# Patient Record
Sex: Male | Born: 1940 | Race: White | Hispanic: No | Marital: Married | Smoking: Former smoker
Health system: Southern US, Community
[De-identification: ages and names within clinical notes are randomized; demographics above are authoritative.]

## PROBLEM LIST (undated history)

## (undated) DIAGNOSIS — E079 Disorder of thyroid, unspecified: Secondary | ICD-10-CM

## (undated) DIAGNOSIS — F1010 Alcohol abuse, uncomplicated: Secondary | ICD-10-CM

## (undated) DIAGNOSIS — I10 Essential (primary) hypertension: Secondary | ICD-10-CM

## (undated) DIAGNOSIS — F0390 Unspecified dementia without behavioral disturbance: Secondary | ICD-10-CM

## (undated) HISTORY — PX: DENTAL SURGERY: SHX609

## (undated) HISTORY — DX: Alcohol abuse, uncomplicated: F10.10

## (undated) HISTORY — PX: EYE SURGERY: SHX253

## (undated) HISTORY — PX: FRACTURE SURGERY: SHX138

## (undated) HISTORY — PX: HAND SURGERY: SHX662

---

## 1998-10-25 ENCOUNTER — Other Ambulatory Visit: Admission: RE | Admit: 1998-10-25 | Discharge: 1998-10-25 | Payer: Self-pay | Admitting: Gastroenterology

## 1999-02-22 ENCOUNTER — Encounter: Payer: Self-pay | Admitting: Orthopedic Surgery

## 1999-02-22 ENCOUNTER — Inpatient Hospital Stay (HOSPITAL_COMMUNITY): Admission: EM | Admit: 1999-02-22 | Discharge: 1999-02-22 | Payer: Self-pay | Admitting: Emergency Medicine

## 1999-02-22 ENCOUNTER — Encounter: Payer: Self-pay | Admitting: Emergency Medicine

## 2000-04-29 ENCOUNTER — Emergency Department (HOSPITAL_COMMUNITY): Admission: EM | Admit: 2000-04-29 | Discharge: 2000-04-29 | Payer: Self-pay | Admitting: *Deleted

## 2003-06-23 ENCOUNTER — Encounter (HOSPITAL_COMMUNITY): Admission: RE | Admit: 2003-06-23 | Discharge: 2003-09-21 | Payer: Self-pay | Admitting: *Deleted

## 2004-03-21 ENCOUNTER — Ambulatory Visit (HOSPITAL_COMMUNITY): Admission: RE | Admit: 2004-03-21 | Discharge: 2004-03-21 | Payer: Self-pay | Admitting: Gastroenterology

## 2004-10-16 ENCOUNTER — Ambulatory Visit: Payer: Self-pay | Admitting: Hematology & Oncology

## 2004-12-12 ENCOUNTER — Ambulatory Visit: Payer: Self-pay | Admitting: Hematology & Oncology

## 2005-01-29 ENCOUNTER — Ambulatory Visit: Payer: Self-pay | Admitting: Hematology & Oncology

## 2005-03-23 ENCOUNTER — Ambulatory Visit: Payer: Self-pay | Admitting: Hematology & Oncology

## 2005-05-18 ENCOUNTER — Ambulatory Visit: Payer: Self-pay | Admitting: Hematology & Oncology

## 2005-08-27 ENCOUNTER — Ambulatory Visit: Payer: Self-pay | Admitting: Hematology & Oncology

## 2005-10-16 ENCOUNTER — Ambulatory Visit: Payer: Self-pay | Admitting: Hematology & Oncology

## 2005-12-20 ENCOUNTER — Ambulatory Visit: Payer: Self-pay | Admitting: Hematology & Oncology

## 2006-02-13 ENCOUNTER — Ambulatory Visit: Payer: Self-pay | Admitting: Hematology & Oncology

## 2006-05-28 ENCOUNTER — Ambulatory Visit: Payer: Self-pay | Admitting: Hematology & Oncology

## 2006-05-30 LAB — CBC WITH DIFFERENTIAL/PLATELET
Basophils Absolute: 0 10*3/uL (ref 0.0–0.1)
EOS%: 1.8 % (ref 0.0–7.0)
HGB: 16 g/dL (ref 13.0–17.1)
LYMPH%: 9.6 % — ABNORMAL LOW (ref 14.0–48.0)
MCH: 34.8 pg — ABNORMAL HIGH (ref 28.0–33.4)
MCV: 99.1 fL — ABNORMAL HIGH (ref 81.6–98.0)
MONO%: 9 % (ref 0.0–13.0)
Platelets: 217 10*3/uL (ref 145–400)
RDW: 12.4 % (ref 11.2–14.6)

## 2006-07-23 ENCOUNTER — Ambulatory Visit: Payer: Self-pay | Admitting: Hematology & Oncology

## 2006-07-25 LAB — CBC WITH DIFFERENTIAL/PLATELET
Basophils Absolute: 0 10*3/uL (ref 0.0–0.1)
Eosinophils Absolute: 0.1 10*3/uL (ref 0.0–0.5)
HCT: 47.3 % (ref 38.7–49.9)
HGB: 16.7 g/dL (ref 13.0–17.1)
LYMPH%: 9.8 % — ABNORMAL LOW (ref 14.0–48.0)
MCV: 98.5 fL — ABNORMAL HIGH (ref 81.6–98.0)
MONO%: 11.1 % (ref 0.0–13.0)
NEUT#: 5.3 10*3/uL (ref 1.5–6.5)
NEUT%: 77 % — ABNORMAL HIGH (ref 40.0–75.0)
Platelets: 261 10*3/uL (ref 145–400)

## 2006-08-01 LAB — CBC WITH DIFFERENTIAL/PLATELET
BASO%: 0.3 % (ref 0.0–2.0)
Basophils Absolute: 0 10*3/uL (ref 0.0–0.1)
EOS%: 1.6 % (ref 0.0–7.0)
HCT: 45.4 % (ref 38.7–49.9)
LYMPH%: 10.7 % — ABNORMAL LOW (ref 14.0–48.0)
MCH: 34.8 pg — ABNORMAL HIGH (ref 28.0–33.4)
MCHC: 35.2 g/dL (ref 32.0–35.9)
NEUT%: 80.2 % — ABNORMAL HIGH (ref 40.0–75.0)
Platelets: 314 10*3/uL (ref 145–400)

## 2006-09-10 ENCOUNTER — Ambulatory Visit: Payer: Self-pay | Admitting: Hematology & Oncology

## 2006-12-25 ENCOUNTER — Ambulatory Visit: Payer: Self-pay | Admitting: Hematology & Oncology

## 2006-12-25 LAB — CBC WITH DIFFERENTIAL/PLATELET
BASO%: 0.2 % (ref 0.0–2.0)
EOS%: 1.3 % (ref 0.0–7.0)
Eosinophils Absolute: 0.1 10*3/uL (ref 0.0–0.5)
LYMPH%: 10.2 % — ABNORMAL LOW (ref 14.0–48.0)
MCH: 31.5 pg (ref 28.0–33.4)
MCHC: 32.8 g/dL (ref 32.0–35.9)
MCV: 96.2 fL (ref 81.6–98.0)
MONO%: 7.8 % (ref 0.0–13.0)
Platelets: 237 10*3/uL (ref 145–400)
RBC: 5.1 10*6/uL (ref 4.20–5.71)

## 2006-12-26 LAB — FERRITIN: Ferritin: 98 ng/mL (ref 22–322)

## 2007-02-25 ENCOUNTER — Ambulatory Visit: Payer: Self-pay | Admitting: Hematology & Oncology

## 2007-02-26 LAB — CBC WITH DIFFERENTIAL/PLATELET
BASO%: 0.4 % (ref 0.0–2.0)
EOS%: 1.1 % (ref 0.0–7.0)
HCT: 43.2 % (ref 38.7–49.9)
MCH: 34.7 pg — ABNORMAL HIGH (ref 28.0–33.4)
MCHC: 35.8 g/dL (ref 32.0–35.9)
NEUT%: 82.9 % — ABNORMAL HIGH (ref 40.0–75.0)
RBC: 4.47 10*6/uL (ref 4.20–5.71)
lymph#: 1 10*3/uL (ref 0.9–3.3)

## 2007-04-29 ENCOUNTER — Ambulatory Visit: Payer: Self-pay | Admitting: Hematology & Oncology

## 2007-05-07 ENCOUNTER — Emergency Department (HOSPITAL_COMMUNITY): Admission: EM | Admit: 2007-05-07 | Discharge: 2007-05-07 | Payer: Self-pay | Admitting: Emergency Medicine

## 2007-06-23 ENCOUNTER — Ambulatory Visit: Payer: Self-pay | Admitting: Hematology & Oncology

## 2007-06-25 LAB — CBC WITH DIFFERENTIAL/PLATELET
EOS%: 1.4 % (ref 0.0–7.0)
Eosinophils Absolute: 0.1 10*3/uL (ref 0.0–0.5)
MCH: 34.4 pg — ABNORMAL HIGH (ref 28.0–33.4)
MCV: 95.5 fL (ref 81.6–98.0)
MONO%: 7.1 % (ref 0.0–13.0)
NEUT#: 7.6 10*3/uL — ABNORMAL HIGH (ref 1.5–6.5)
RBC: 4.73 10*6/uL (ref 4.20–5.71)
RDW: 12.8 % (ref 11.2–14.6)

## 2007-08-25 ENCOUNTER — Ambulatory Visit: Payer: Self-pay | Admitting: Hematology & Oncology

## 2007-10-24 ENCOUNTER — Ambulatory Visit: Payer: Self-pay | Admitting: Hematology & Oncology

## 2008-08-26 ENCOUNTER — Ambulatory Visit (HOSPITAL_COMMUNITY): Admission: RE | Admit: 2008-08-26 | Discharge: 2008-08-26 | Payer: Self-pay | Admitting: General Surgery

## 2009-09-23 ENCOUNTER — Encounter: Admission: RE | Admit: 2009-09-23 | Discharge: 2009-09-23 | Payer: Self-pay | Admitting: Family Medicine

## 2011-04-24 NOTE — Op Note (Signed)
NAME:  Michael Weaver, Michael Weaver NO.:  [id number]   MEDICAL RECORD NO.:  [id number]          PATIENT TYPE:  AMB   LOCATION:  DAY                          FACILITY:  [facility]   PHYSICIAN:  Juanetta Gosling, MDDATE OF BIRTH:  01-21-1941   DATE OF PROCEDURE:  08/26/2008  DATE OF DISCHARGE:                               OPERATIVE REPORT   PREOPERATIVE DIAGNOSIS:  Recurrent left inguinal hernia.   POSTOPERATIVE DIAGNOSIS:  Recurrent indirect left inguinal hernia.   PROCEDURE:  Laparoscopic left inguinal hernia repair with 12 x 15 cm  UltraPro mesh.   SURGEON:  Juanetta Gosling, MD   ASSISTANT:  Ardeth Sportsman, MD   ANESTHESIA:  General.   SPECIMENS:  None.   DRAINS:  None.   COMPLICATIONS:  None.   ESTIMATED BLOOD LOSS:  Minimal.   DISPOSITION:  To PACU in stable condition.  Mr. Kling is a 70 year old  male who has a prior history of bilateral open repairs in the 60s and he  returns with a several day history of a left groin bulge.  On exam he  had a reducible left inguinal hernia.  I counseled him for laparoscopic  repair of this recurrent hernia.   PROCEDURE:  After informed consent was obtained, the patient was taken  to the operating room.  He was administered 1 gram of intravenous Ancef  prior to surgery.  He then underwent general endotracheal anesthesia  without complication.  A Foley catheter was placed.  His abdomen was  then prepped and draped in standard sterile surgical fashion.  Surgical  time-out was then performed. A 10 mm infraumbilical curvilinear incision  was then made.  Dissection was carried out down to the level of his  external abdominal oblique.  This was incised, the rectus muscles pulled  lateral.  The dissection balloon was then inserted with the help of an S  retractor into the preperitoneal space.  A 0 degree laparoscopic was  then inserted and the balloon was blown up to create our preperitoneal  space.  I used 40 pumps of the  balloon initially I then deflated it,  rolled it more laterally on the left side and used an additional 15  pumps on the balloon to open up the space.  After this, the balloon was  then removed the trocar inserted.  The gas was turned on.  The space was  visualized where there was good hemostasis.  I then began to perform  dissection laterally and identified the psoas muscle with the nerves  running on the psoas muscle.  He had a large indirect hernia sac and a  small direct hernia sac.  The cord structures were identified to include  the vas deferens and preserved throughout the operation.  The hernia sac  was then teased from the cord structures and pulled from the hernia and  this was then pulled back cephalad so that at the completion of this the  entire cord, the iliac vein were visualized easily.  He also a small  direct hernia which reduced with the balloon insufflation.  When  the  hernia sac had been pulled far enough cephalad, all structures had been  identified.  I then used a 12 x 15 cm UltraPro mesh and placed this over  the entire region.  This was tacked once laterally and superiorly near  the ASIS or at the level of the ASIS.  This was also then tacked just  above the direct defect to the anterior abdominal wall. This was also  then tacked twice to Cooper's ligament. The mesh was in good position at  the completion of this.  I then pulled the hernia sac cephalad and  desufflated the abdomen and laid hernia sac on top of the mesh.  After  all the trocars were then removed, the fascia was then closed with a 0  Vicryl suture.  Skin was all closed with 4-0 Monocryl suture in a  subcuticular fashion.  Dermabond was placed on the wounds.  Foley was  removed in the operating room, he was extubated, transferred to PACU in  stable condition.      Juanetta Gosling, MD  Electronically Signed     MCW/MEDQ  D:  08/26/2008  T:  08/28/2008  Job:  [id number]

## 2011-04-27 NOTE — Op Note (Signed)
NAME:  Michael Weaver, Michael Weaver NO.:  [id number]   MEDICAL RECORD NO.:  [id number]                   PATIENT TYPE:  AMB   LOCATION:  ENDO                                 FACILITY:  [facility]   PHYSICIAN:  Graylin Shiver, M.D.                DATE OF BIRTH:  1941/03/17   DATE OF PROCEDURE:  03/21/2004  DATE OF DISCHARGE:                                 OPERATIVE REPORT   INDICATIONS:  History of colon polyp.   Informed consent was obtained after the explanation of the risks of  bleeding, infection, and perforation.   PREMEDICATION:  Fentanyl 50 mcg IV, Versed 4 mg IV.   PROCEDURE:  With the patient in the left lateral decubitus position, a  rectal exam was performed.  No masses were felt.  The Olympus colonoscope  was inserted into the rectum and advanced around the colon to the cecum.  Cecal landmarks were identified.  The scope was brought out, visualizing the  colonic mucosa, which showed universal diverticulosis.  No polyps were seen.  The rectum and base of the cecum were free of diverticula, although there  were scattered diverticula around the rest of the colon.  He tolerated the  procedure well without complications.   IMPRESSION:  Universal diverticulosis.   PLAN:  In view of his history of colon polyps in the past, I would recommend  a follow-up surveillance colonoscopy in five years.                                               Graylin Shiver, M.D.    SFG/MEDQ  D:  03/21/2004  T:  03/21/2004  Job:  [id number]   cc:   Stacie Acres. White, M.D.  [address]  [place]  [place] [address]  Fax: [phone]

## 2011-09-12 LAB — CBC
HCT: 47.8
MCV: 102.4 — ABNORMAL HIGH
RBC: 4.67
WBC: 7.7

## 2011-09-12 LAB — COMPREHENSIVE METABOLIC PANEL
AST: 24
BUN: 12
CO2: 31
Chloride: 97
Creatinine, Ser: 0.96
GFR calc Af Amer: >60
GFR calc non Af Amer: >60
Total Bilirubin: 1

## 2011-09-12 LAB — DIFFERENTIAL
Basophils Absolute: 0
Eosinophils Relative: 1
Lymphocytes Relative: 8 — ABNORMAL LOW

## 2012-08-20 ENCOUNTER — Inpatient Hospital Stay (HOSPITAL_COMMUNITY)
Admission: EM | Admit: 2012-08-20 | Discharge: 2012-08-23 | DRG: 683 | Disposition: A | Discharge status: Home / Self Care | Payer: Medicare Other | Attending: Internal Medicine | Admitting: Internal Medicine

## 2012-08-20 ENCOUNTER — Emergency Department (HOSPITAL_COMMUNITY): Payer: Medicare Other

## 2012-08-20 ENCOUNTER — Encounter (HOSPITAL_COMMUNITY): Payer: Self-pay | Admitting: Emergency Medicine

## 2012-08-20 DIAGNOSIS — E860 Dehydration: Secondary | ICD-10-CM

## 2012-08-20 DIAGNOSIS — E871 Hypo-osmolality and hyponatremia: Secondary | ICD-10-CM | POA: Diagnosis present

## 2012-08-20 DIAGNOSIS — R197 Diarrhea, unspecified: Secondary | ICD-10-CM | POA: Diagnosis present

## 2012-08-20 DIAGNOSIS — Z87891 Personal history of nicotine dependence: Secondary | ICD-10-CM

## 2012-08-20 DIAGNOSIS — Z66 Do not resuscitate: Secondary | ICD-10-CM | POA: Diagnosis present

## 2012-08-20 DIAGNOSIS — R634 Abnormal weight loss: Secondary | ICD-10-CM | POA: Diagnosis present

## 2012-08-20 DIAGNOSIS — F0390 Unspecified dementia without behavioral disturbance: Secondary | ICD-10-CM | POA: Diagnosis present

## 2012-08-20 DIAGNOSIS — R946 Abnormal results of thyroid function studies: Secondary | ICD-10-CM | POA: Diagnosis present

## 2012-08-20 DIAGNOSIS — E875 Hyperkalemia: Secondary | ICD-10-CM | POA: Diagnosis present

## 2012-08-20 DIAGNOSIS — N179 Acute kidney failure, unspecified: Secondary | ICD-10-CM

## 2012-08-20 DIAGNOSIS — I959 Hypotension, unspecified: Secondary | ICD-10-CM | POA: Diagnosis present

## 2012-08-20 DIAGNOSIS — N170 Acute kidney failure with tubular necrosis: Principal | ICD-10-CM | POA: Diagnosis present

## 2012-08-20 DIAGNOSIS — R7989 Other specified abnormal findings of blood chemistry: Secondary | ICD-10-CM | POA: Diagnosis present

## 2012-08-20 DIAGNOSIS — K08409 Partial loss of teeth, unspecified cause, unspecified class: Secondary | ICD-10-CM | POA: Diagnosis present

## 2012-08-20 DIAGNOSIS — I10 Essential (primary) hypertension: Secondary | ICD-10-CM | POA: Diagnosis present

## 2012-08-20 HISTORY — DX: Essential (primary) hypertension: I10

## 2012-08-20 HISTORY — DX: Unspecified dementia, unspecified severity, without behavioral disturbance, psychotic disturbance, mood disturbance, and anxiety: F03.90

## 2012-08-20 LAB — COMPREHENSIVE METABOLIC PANEL
BUN: 78 mg/dL — ABNORMAL HIGH (ref 6–23)
CO2: 18 mEq/L — ABNORMAL LOW (ref 19–32)
Calcium: 8.8 mg/dL (ref 8.4–10.5)
Chloride: 101 mEq/L (ref 96–112)
Creatinine, Ser: 5.53 mg/dL — ABNORMAL HIGH (ref 0.50–1.35)
GFR calc Af Amer: 11 mL/min — ABNORMAL LOW (ref >90)
GFR calc non Af Amer: 9 mL/min — ABNORMAL LOW (ref >90)
Total Bilirubin: 0.3 mg/dL (ref 0.3–1.2)

## 2012-08-20 LAB — CBC WITH DIFFERENTIAL/PLATELET
Basophils Absolute: 0 10*3/uL (ref 0.0–0.1)
Eosinophils Relative: 1 % (ref 0–5)
Lymphocytes Relative: 6 % — ABNORMAL LOW (ref 12–46)
MCV: 98.4 fL (ref 78.0–100.0)
Neutrophils Relative %: 85 % — ABNORMAL HIGH (ref 43–77)
Platelets: 191 10*3/uL (ref 150–400)
RDW: 12.1 % (ref 11.5–15.5)
WBC: 8.9 10*3/uL (ref 4.0–10.5)

## 2012-08-20 LAB — CK TOTAL AND CKMB (NOT AT ARMC): Total CK: 83 U/L (ref 7–232)

## 2012-08-20 LAB — URINALYSIS, ROUTINE W REFLEX MICROSCOPIC
Hgb urine dipstick: NEGATIVE
Nitrite: NEGATIVE
Protein, ur: NEGATIVE mg/dL
Specific Gravity, Urine: 1.02 (ref 1.005–1.030)
Urobilinogen, UA: 0.2 mg/dL (ref 0.0–1.0)

## 2012-08-20 LAB — URINE MICROSCOPIC-ADD ON

## 2012-08-20 LAB — LACTIC ACID, PLASMA: Lactic Acid, Venous: 1.4 mmol/L (ref 0.5–2.2)

## 2012-08-20 MED ORDER — HYDROCODONE-ACETAMINOPHEN 5-325 MG PO TABS: 1.0000 | ORAL_TABLET | ORAL | Status: DC | PRN

## 2012-08-20 MED ORDER — SODIUM CHLORIDE 0.9 % IV BOLUS (SEPSIS)
1000.0000 mL | Freq: Once | INTRAVENOUS | Status: AC
  Administered 2012-08-20: 1000 mL via INTRAVENOUS

## 2012-08-20 MED ORDER — SODIUM CHLORIDE 0.9 % IV SOLN: INTRAVENOUS | Status: AC

## 2012-08-20 MED ORDER — THIAMINE HCL 100 MG/ML IJ SOLN
Freq: Once | INTRAVENOUS | Status: AC
  Administered 2012-08-21: 03:00:00 via INTRAVENOUS
  Filled 2012-08-20: qty 1000

## 2012-08-20 MED ORDER — ALBUTEROL SULFATE (5 MG/ML) 0.5% IN NEBU: 2.5000 mg | INHALATION_SOLUTION | RESPIRATORY_TRACT | Status: DC | PRN

## 2012-08-20 MED ORDER — DEXTROSE 50 % IV SOLN
1.0000 | Freq: Once | INTRAVENOUS | Status: AC
  Administered 2012-08-20: 50 mL via INTRAVENOUS
  Filled 2012-08-20: qty 50

## 2012-08-20 MED ORDER — ACETAMINOPHEN 650 MG RE SUPP: 650.0000 mg | Freq: Four times a day (QID) | RECTAL | Status: DC | PRN

## 2012-08-20 MED ORDER — LACTATED RINGERS IV BOLUS (SEPSIS)
1000.0000 mL | Freq: Once | INTRAVENOUS | Status: AC
  Administered 2012-08-20: 1000 mL via INTRAVENOUS

## 2012-08-20 MED ORDER — DONEPEZIL HCL 10 MG PO TABS
10.0000 mg | ORAL_TABLET | Freq: Every day | ORAL | Status: DC
  Administered 2012-08-21 – 2012-08-22 (×2): 10 mg via ORAL
  Filled 2012-08-20 (×4): qty 1

## 2012-08-20 MED ORDER — DEXTROSE IN LACTATED RINGERS 5 % IV SOLN
INTRAVENOUS | Status: DC
  Administered 2012-08-21: 16:00:00 via INTRAVENOUS

## 2012-08-20 MED ORDER — INSULIN ASPART 100 UNIT/ML ~~LOC~~ SOLN
10.0000 [IU] | Freq: Once | SUBCUTANEOUS | Status: AC
  Administered 2012-08-20: 10 [IU] via INTRAVENOUS
  Filled 2012-08-20: qty 1

## 2012-08-20 MED ORDER — LEVOTHYROXINE SODIUM 150 MCG PO TABS
150.0000 ug | ORAL_TABLET | Freq: Every day | ORAL | Status: DC
  Administered 2012-08-21 – 2012-08-23 (×3): 150 ug via ORAL
  Filled 2012-08-20 (×3): qty 1

## 2012-08-20 MED ORDER — CITALOPRAM HYDROBROMIDE 40 MG PO TABS
40.0000 mg | ORAL_TABLET | Freq: Every day | ORAL | Status: DC
  Administered 2012-08-21 – 2012-08-23 (×3): 40 mg via ORAL
  Filled 2012-08-20 (×3): qty 1

## 2012-08-20 MED ORDER — ACETAMINOPHEN 325 MG PO TABS: 650.0000 mg | ORAL_TABLET | Freq: Four times a day (QID) | ORAL | Status: DC | PRN

## 2012-08-20 MED ORDER — DEXTROSE IN LACTATED RINGERS 5 % IV SOLN
INTRAVENOUS | Status: DC
  Administered 2012-08-20: 19:00:00 via INTRAVENOUS

## 2012-08-20 MED ORDER — PANTOPRAZOLE SODIUM 40 MG PO TBEC
40.0000 mg | DELAYED_RELEASE_TABLET | Freq: Every day | ORAL | Status: DC
  Administered 2012-08-21 – 2012-08-23 (×3): 40 mg via ORAL
  Filled 2012-08-20 (×3): qty 1

## 2012-08-20 MED ORDER — SODIUM CHLORIDE 0.9 % IV SOLN
1.0000 g | Freq: Once | INTRAVENOUS | Status: AC
  Administered 2012-08-20: 1 g via INTRAVENOUS
  Filled 2012-08-20 (×2): qty 10

## 2012-08-20 MED ORDER — SODIUM CHLORIDE 0.9 % IJ SOLN
3.0000 mL | Freq: Two times a day (BID) | INTRAMUSCULAR | Status: DC
  Administered 2012-08-21 – 2012-08-23 (×5): 3 mL via INTRAVENOUS

## 2012-08-20 MED ORDER — ONDANSETRON HCL 4 MG PO TABS: 4.0000 mg | ORAL_TABLET | Freq: Four times a day (QID) | ORAL | Status: DC | PRN

## 2012-08-20 MED ORDER — ONDANSETRON HCL 4 MG/2ML IJ SOLN
4.0000 mg | Freq: Four times a day (QID) | INTRAMUSCULAR | Status: DC | PRN
  Filled 2012-08-20: qty 2

## 2012-08-20 MED ORDER — GUAIFENESIN-DM 100-10 MG/5ML PO SYRP
5.0000 mL | ORAL_SOLUTION | ORAL | Status: DC | PRN
  Filled 2012-08-20: qty 5

## 2012-08-20 NOTE — ED Notes (Signed)
EDP in to assess 

## 2012-08-20 NOTE — ED Notes (Signed)
Pt aware of need for urine specimen; urinal placed at bedside  

## 2012-08-20 NOTE — ED Provider Notes (Signed)
History     CSN: [id number]  Arrival date & time 08/20/12  1531   First MD Initiated Contact with Patient 08/20/12 1546      Chief Complaint  Patient presents with  . Hypotension    (Consider location/radiation/quality/duration/timing/severity/associated sxs/prior treatment) HPIPaul D Weaver is a 71 y.o. male presents for "hypotension". Patient had a followup visit with his dentist this morning and was found to be hypotensive and was referred to the emergency department. He has a past pertinent surgical history of having had all of his mandibular teeth removed about 3 weeks ago and since then has not been eating well and has had intermittent diarrhea. He lives next door to his daughter, she is his caregiver however it is not privy to everything he does - he is a bit secretive about ailments.  Patient is currently not complaining about any pain but does say he's a bit fatigued.  He said some dizziness on standing.  The dizziness, has been going on for about 2 weeks. It has been steady, constant.   Denies any fevers, chills, chest pain, shortnes, syncopes, numbness or tingling, shortness of breath.  Past Medical History  Diagnosis Date  . Dementia   . Hypertension     Past Surgical History  Procedure Date  . Dental surgery   . Hand surgery     No family history on file.  History  Substance Use Topics  . Smoking status: Never Smoker   . Smokeless tobacco: Not on file  . Alcohol Use: Yes   Review of SystemsAt least 10pt or greater review of systems completed and are negative except where specified in the HPI.  Allergies  Review of patient's allergies indicates no known allergies.  Home Medications   Current Outpatient Rx  Name Route Sig Dispense Refill  . VITAMIN D 1000 UNITS PO TABS Oral Take 1,000 Units by mouth daily.    Marland Kitchen CITALOPRAM HYDROBROMIDE 40 MG PO TABS Oral Take 40 mg by mouth daily.    . DONEPEZIL HCL 10 MG PO TABS Oral Take 10 mg by mouth at bedtime.    Marland Kitchen  LEVOTHYROXINE SODIUM 150 MCG PO TABS Oral Take 150 mcg by mouth daily.    Marland Kitchen LISINOPRIL 10 MG PO TABS Oral Take 10 mg by mouth daily.    Marland Kitchen MELATONIN 1 MG PO TABS Oral Take 1 tablet by mouth at bedtime.    Marland Kitchen PRENATAL MULTIVITAMIN CH Oral Take 1 tablet by mouth daily.      BP 88/50  Pulse 63  Temp 98 F (36.7 C) (Oral)  Resp 18  SpO2 98%  Physical Exam  Nursing notes reviewed.  Electronic medical record reviewed. VITAL SIGNS:   Filed Vitals:   08/20/12 1613 08/20/12 1720 08/20/12 1852 08/20/12 1937  BP: 80/47 88/50 92/68  109/56  Pulse: 68 63 73 68  Temp:      TempSrc:   Oral   Resp:      SpO2: 97% 98% 100% 100%   CONSTITUTIONAL: Awake, oriented, appears non-toxic, thin HENT: Atraumatic, normocephalic, oral mucosa pink and moist, airway patent. Nares patent without drainage. External ears normal. EYES: Conjunctiva clear, EOMI, PERRLA NECK: Trachea midline, non-tender, supple CARDIOVASCULAR: Normal heart rate, Normal rhythm, No murmurs, rubs, gallops PULMONARY/CHEST: Clear to auscultation, no rhonchi, wheezes, or rales. Symmetrical breath sounds. Non-tender. ABDOMINAL: Non-distended, soft, non-tender - no rebound or guarding.  BS normal. NEUROLOGIC: Non-focal, moving all four extremities, no gross sensory or motor deficits. EXTREMITIES: No clubbing, cyanosis, or edema  SKIN: Warm, Dry, No erythema, No rash   ED Course  Procedures (including critical care time)   Date: 08/20/2012  Rate: 67  Rhythm: normal sinus rhythm  QRS Axis: normal  Intervals: normal  ST/T Wave abnormalities: normal  Conduction Disutrbances: none  Narrative Interpretation: unremarkable, no peaked T waves, and nonischemic. Negative when compared with prior EKG dated 08/23/2008   Labs Reviewed  COMPREHENSIVE METABOLIC PANEL - Abnormal; Notable for the following:    Sodium 132 (*)     Potassium 6.0 (*)     CO2 18 (*)     BUN 78 (*)     Creatinine, Ser 5.53 (*)     GFR calc non Af Amer 9 (*)     GFR  calc Af Amer 11 (*)     All other components within normal limits  CBC WITH DIFFERENTIAL - Abnormal; Notable for the following:    MCH 34.8 (*)     Neutrophils Relative 85 (*)     Lymphocytes Relative 6 (*)     Lymphs Abs 0.5 (*)     All other components within normal limits  LACTIC ACID, PLASMA  URINALYSIS, ROUTINE W REFLEX MICROSCOPIC  CULTURE, BLOOD (ROUTINE X 2)  CULTURE, BLOOD (ROUTINE X 2)   Dg Chest 2 View  08/20/2012  *RADIOLOGY REPORT*  Clinical Data: Hypotension, weakness.  CHEST - 2 VIEW  Comparison: 08/23/2008.  Findings: Heart and mediastinal contours are within normal limits. No focal opacities or effusions.  No acute bony abnormality.  Old right-sided rib fractures.  IMPRESSION: No active cardiopulmonary disease.   Original Report Authenticated By: Cyndie Chime, M.D.    No diagnosis found.  MDM  Michael Weaver is a 71 y.o. male with the recent past medical history dental surgery presenting with weakness, diarrhea and dizzinesOverall the patient looks well however he is hypotensive Hytrin and started fluid resuscitation. Patient's labs revealed acute renal failure with hyperkalemia potassium 6.0. There are no peaked T waves, there is no bradycardia. We'll treat the hyperkalemia with insulin and calcium gluconate.  Patient continues to be fluid resuscitated, discussed with hospitalist for admission for acute renal failure likely due to prerenal failure secondary to dehydration secondary to a tooth extraction. Does not appear the patient has an infection. It also does not appear like that the patient has been on any new medications causing acute renal failure. Patient does have a waxy casts in his urine suggestive of acute tubular necrosis.         Jones Skene, MD 08/20/12 2050

## 2012-08-20 NOTE — ED Notes (Signed)
Pt seen by dentist this morning and BP 89/54.  Daughter was told to keep monitoring BP and at home it was 82/49.  Pt had dental surgery 3 weeks ago and has had decreased po intake.  Reports diarrhea and dizziness x 2 weeks.

## 2012-08-20 NOTE — H&P (Signed)
PCP:   Bradd Burner, PA    Chief Complaint:   Hypotension at home 83/49  HPI: Michael Weaver is a 71 y.o. male   has a past medical history of Dementia and Hypertension.   Presented with   3 weeks history of diarrhea poor PO intake. He have had dental surgeries done to remove all of his lower teeth due to decay. He was not able to eat except fluid food and he has not been consuming enough. He started to feel light headed.  Once he started to have only liquid diet he began to have diarrhea. His diet consisted of coffee, cranberry juice and V8.  His urine has become dark and he had decreased urine output he is not sure if he was even urinating at all for the past few days. He denies getting any antibiotics. NO nausea, no vomiting no fever or chills.  He lives at home but next door to his daughter. Today she stopped over to check on him and checked his blood pressure that was found to be very low with SBP of 83 at this point he was brought in to ER Review of Systems:    Pertinent positives include:Tooth/dental problems, diarrhea,  Constitutional:  No weight loss, night sweats, Fevers, chills, fatigue, weight loss  HEENT:  No headaches, Difficulty swallowing,Sore throat,  No sneezing, itching, ear ache, nasal congestion, post nasal drip,  Cardio-vascular:  No chest pain, Orthopnea, PND, anasarca, dizziness, palpitations.no Bilateral lower extremity swelling  GI:  No heartburn, indigestion, abdominal pain, nausea, vomiting,  change in bowel habits, loss of appetite, melena, blood in stool, hematemesis Resp:  no shortness of breath at rest. No dyspnea on exertion, No excess mucus, no productive cough, No non-productive cough, No coughing up of blood.No change in color of mucus.No wheezing. Skin:  no rash or lesions. No jaundice GU:  no dysuria, change in color of urine, no urgency or frequency. No straining to urinate.  No flank pain.  Musculoskeletal:  No joint pain or no  joint swelling. No decreased range of motion. No back pain.  Psych:  No change in mood or affect. No depression or anxiety. No memory loss.  Neuro: no localizing neurological complaints, no tingling, no weakness, no double vision, no gait abnormality, no slurred speech, no confusion  Otherwise ROS are negative except for above, 10 systems were reviewed  Past Medical History: Past Medical History  Diagnosis Date  . Dementia   . Hypertension    Past Surgical History  Procedure Date  . Dental surgery   . Hand surgery      Medications: Prior to Admission medications   Medication Sig Start Date End Date Taking? Authorizing Provider  cholecalciferol (VITAMIN D) 1000 UNITS tablet Take 1,000 Units by mouth daily.   Yes Historical Provider, MD  citalopram (CELEXA) 40 MG tablet Take 40 mg by mouth daily.   Yes Historical Provider, MD  donepezil (ARICEPT) 10 MG tablet Take 10 mg by mouth at bedtime.   Yes Historical Provider, MD  levothyroxine (SYNTHROID, LEVOTHROID) 150 MCG tablet Take 150 mcg by mouth daily.   Yes Historical Provider, MD  lisinopril (PRINIVIL,ZESTRIL) 10 MG tablet Take 10 mg by mouth daily.   Yes Historical Provider, MD  Melatonin 1 MG TABS Take 1 tablet by mouth at bedtime.   Yes Historical Provider, MD  Prenatal Vit-Fe Fumarate-FA (PRENATAL MULTIVITAMIN) TABS Take 1 tablet by mouth daily.   Yes Historical Provider, MD    Allergies:  No  Known Allergies  Social History:  Ambulatory independently  Lives at  Home alone MPOA: Pricilla Handler [phone]   reports that he has quit smoking. His smoking use included Cigarettes. He has a 30 pack-year smoking history. He does not have any smokeless tobacco history on file. He reports that he drinks alcohol. He reports that he does not use illicit drugs.   Family History: family history includes Breast cancer in his sister.    Physical Exam: Patient Vitals for the past 24 hrs:  BP Temp Temp src Pulse Resp SpO2    08/20/12 1937 109/56 mmHg - - 68  - 100 %  08/20/12 1852 92/68 mmHg - Oral 73  - 100 %  08/20/12 1720 88/50 mmHg - - 63  - 98 %  08/20/12 1613 80/47 mmHg - - 68  - 97 %  08/20/12 1537 81/46 mmHg 98 F (36.7 C) Oral 73  18  92 %    1. General:  in No Acute distress 2. Psychological: Alert and Oriented 3. Head/ENT:    Dry Mucous Membranes                          Head Non traumatic, neck supple                           Poor Dentition 4. SKIN:  decreased Skin turgor,  Skin clean Dry and intact no rash 5. Heart: Regular rate and rhythm systolic ejection Murmur, no Rub or gallop 6. Lungs: Clear to auscultation bilaterally, no wheezes or crackles   7. Abdomen: Soft, non-tender, Non distended 8. Lower extremities: no clubbing, cyanosis, or edema 9. Neurologically Grossly intact, moving all 4 extremities equally 10. MSK: Normal range of motion  body mass index is unknown because there is no height or weight on file.   Labs on Admission:   [facility] 08/20/12 1708  NA 132*  K 6.0*  CL 101  CO2 18*  GLUCOSE 93  BUN 78*  CREATININE 5.53*  CALCIUM 8.8  MG --  PHOS --    Basename 08/20/12 1708  AST 15  ALT 18  ALKPHOS 68  BILITOT 0.3  PROT 6.5  ALBUMIN 3.9   No results found for this basename: LIPASE:2,AMYLASE:2 in the last 72 hours  Basename 08/20/12 1708  WBC 8.9  NEUTROABS 7.6  HGB 15.0  HCT 42.4  MCV 98.4  PLT 191   No results found for this basename: CKTOTAL:3,CKMB:3,CKMBINDEX:3,TROPONINI:3 in the last 72 hours No results found for this basename: TSH,T4TOTAL,FREET3,T3FREE,THYROIDAB in the last 72 hours No results found for this basename: VITAMINB12:2,FOLATE:2,FERRITIN:2,TIBC:2,IRON:2,RETICCTPCT:2 in the last 72 hours No results found for this basename: HGBA1C    CrCl is unknown because there is no height on file for the current visit. ABG No results found for this basename: phart, pco2, po2, hco3, tco2, acidbasedef, o2sat     No results found for this  basename: DDIMER     Other results:  I have pearsonaly reviewed this: ECG REPORT  Rate: 67  Rhythm: Sinus rhythm ST&T Change: non specific changes  UA waxy casts, concentrated   Cultures: No results found for this basename: sdes, specrequest, cult, reptstatus       Radiological Exams on Admission: Dg Chest 2 View  08/20/2012  *RADIOLOGY REPORT*  Clinical Data: Hypotension, weakness.  CHEST - 2 VIEW  Comparison: 08/23/2008.  Findings: Heart and mediastinal contours are within normal limits.  No focal opacities or effusions.  No acute bony abnormality.  Old right-sided rib fractures.  IMPRESSION: No active cardiopulmonary disease.   Original Report Authenticated By: Cyndie Chime, M.D.     Chart has been reviewed  Assessment/Plan  71 yo w hx of mild dementia and HTN here with ARF in the setting of dehydration  Present on Admission:  .Hypotension - this is secondary to dehydration already improved after administration of IV fluids will watch patient and step down. He have not had any fevers or any other reason to suspect infection. Blood cultures were drawn by emergency department will await results of those. At this point as his blood pressures improving with IV fluids and his history is consistent with dehydration will hold off on IV antibiotics  .Acute renal failure - - likely secondary to dehydration, check FeNA and will treat with  IVF,  would obtain renal US and consider renal consult in a.m. at this point there is no indication for emergency dialysis will stop lisinopril   .Dementia -mild continue Aricept   .Dehydration - severe we'll rehydrate and keep an eye on his electrolytes  .Diarrhea - etiology unclear he states that this is likely secondary to fluid only diet we'll obtain stool cultures he denies any exposure to antibiotics  .Hyperkalemia - patient has been treated in emergency department already we'll repeat a electrolytes and see if he needs further treatment of his  potassium has improved  .Hyponatremia - - likely secondary to dehydration, will give IVF, check Urine Na, Cr, Osmolarity. Monitor Na levels to avoid over aggressive correction. Check TSH. Stop offending medications. If no improvement with IVF will initiate further work up for SIADH if appropriate.     Prophylaxis: SCD Protonix  CODE STATUS: DNR/DNI  Other plan as per orders.  I have spent a total of 55 min on this admission  Zarah Carbon 08/20/2012, 8:43 PM

## 2012-08-20 NOTE — ED Notes (Signed)
Reports diarrhea x3 weeks since having oral sx; had additional oral sx today (teeth being extracted for dentures); states was hypotensive in dentist's office; therefore, they sent him to ED for further eval; presently c/o interm dizziness; no c/o pain at this time; family at bedside; pt awake, alert, oriented x4; skin warm, dry

## 2012-08-20 NOTE — ED Notes (Signed)
Transported to xray dept via stretcher per xray staff 

## 2012-08-20 NOTE — ED Notes (Signed)
Dr.Bonk made aware of present BP - orders given - see MAR

## 2012-08-20 NOTE — ED Notes (Signed)
Report called to Carmelia Bake, RN in CDU

## 2012-08-21 ENCOUNTER — Inpatient Hospital Stay (HOSPITAL_COMMUNITY): Payer: Medicare Other

## 2012-08-21 DIAGNOSIS — R7989 Other specified abnormal findings of blood chemistry: Secondary | ICD-10-CM | POA: Diagnosis present

## 2012-08-21 DIAGNOSIS — R634 Abnormal weight loss: Secondary | ICD-10-CM | POA: Diagnosis present

## 2012-08-21 DIAGNOSIS — K08409 Partial loss of teeth, unspecified cause, unspecified class: Secondary | ICD-10-CM | POA: Diagnosis present

## 2012-08-21 LAB — CBC
HCT: 43.1 % (ref 39.0–52.0)
MCH: 34.3 pg — ABNORMAL HIGH (ref 26.0–34.0)
MCHC: 35 g/dL (ref 30.0–36.0)
MCV: 98 fL (ref 78.0–100.0)
RDW: 12.1 % (ref 11.5–15.5)
WBC: 6.5 10*3/uL (ref 4.0–10.5)

## 2012-08-21 LAB — PHOSPHORUS: Phosphorus: 4.7 mg/dL — ABNORMAL HIGH (ref 2.3–4.6)

## 2012-08-21 LAB — TSH: TSH: 0.269 u[IU]/mL — ABNORMAL LOW (ref 0.350–4.500)

## 2012-08-21 LAB — COMPREHENSIVE METABOLIC PANEL
ALT: 15 U/L (ref 0–53)
AST: 15 U/L (ref 0–37)
Albumin: 3.5 g/dL (ref 3.5–5.2)
Alkaline Phosphatase: 64 U/L (ref 39–117)
Chloride: 108 mEq/L (ref 96–112)
Potassium: 5.4 mEq/L — ABNORMAL HIGH (ref 3.5–5.1)
Sodium: 137 mEq/L (ref 135–145)
Total Protein: 6 g/dL (ref 6.0–8.3)

## 2012-08-21 LAB — BASIC METABOLIC PANEL
BUN: 70 mg/dL — ABNORMAL HIGH (ref 6–23)
BUN: 57 mg/dL — ABNORMAL HIGH (ref 6–23)
CO2: 18 mEq/L — ABNORMAL LOW (ref 19–32)
Calcium: 8.3 mg/dL — ABNORMAL LOW (ref 8.4–10.5)
Calcium: 8.4 mg/dL (ref 8.4–10.5)
Creatinine, Ser: 4.6 mg/dL — ABNORMAL HIGH (ref 0.50–1.35)
Creatinine, Ser: 2.39 mg/dL — ABNORMAL HIGH (ref 0.50–1.35)
GFR calc non Af Amer: 12 mL/min — ABNORMAL LOW (ref >90)
GFR calc non Af Amer: 20 mL/min — ABNORMAL LOW (ref >90)
Glucose, Bld: 135 mg/dL — ABNORMAL HIGH (ref 70–99)
Glucose, Bld: 137 mg/dL — ABNORMAL HIGH (ref 70–99)
Glucose, Bld: 78 mg/dL (ref 70–99)
Potassium: 5 mEq/L (ref 3.5–5.1)
Sodium: 136 mEq/L (ref 135–145)

## 2012-08-21 LAB — OSMOLALITY, URINE: Osmolality, Ur: 340 mOsm/kg — ABNORMAL LOW (ref 390–1090)

## 2012-08-21 LAB — MAGNESIUM: Magnesium: 2.4 mg/dL (ref 1.5–2.5)

## 2012-08-21 LAB — SODIUM, URINE, RANDOM: Sodium, Ur: 48 mEq/L

## 2012-08-21 MED ORDER — ENSURE COMPLETE PO LIQD
237.0000 mL | Freq: Three times a day (TID) | ORAL | Status: DC
  Administered 2012-08-22 – 2012-08-23 (×4): 237 mL via ORAL

## 2012-08-21 MED ORDER — SODIUM CHLORIDE 0.9 % IV SOLN
INTRAVENOUS | Status: DC
  Administered 2012-08-21 – 2012-08-23 (×4): via INTRAVENOUS

## 2012-08-21 MED ORDER — ENSURE PUDDING PO PUDG
1.0000 | Freq: Three times a day (TID) | ORAL | Status: DC
  Administered 2012-08-21 – 2012-08-23 (×5): 1 via ORAL

## 2012-08-21 MED ORDER — BOOST / RESOURCE BREEZE PO LIQD
1.0000 | Freq: Three times a day (TID) | ORAL | Status: DC
  Administered 2012-08-21: 1 via ORAL

## 2012-08-21 NOTE — Progress Notes (Signed)
[facility] Progress Note [place] TEAM 1 - Stepdown/ICU TEAM   Michael Weaver WUJ:[id number] DOB: [birth year] DOA: 08/20/2012 PCP: Bradd Burner, PA  Brief narrative: 71 year old male patient with underlying history of dementia and hypertension. Patient presented to the emergency department after his daughter discovered his pressure was low at 83 systolic. This patient had recently undergone extensive dental extraction 3 weeks prior and his diet has consisted of coffee cranberry juice, V8 juice. In addition he was having watery stools since the teeth extraction. In the emergency department he was found to be hypotensive but this did respond to IV fluids. He did not appear to have any criteria for sepsis. He was also found to have acute renal failure with hyperkalemia with a creatinine of 5.53. He was started on IV fluids and due to the degree of his renal failure with associated hyperkalemia he was admitted to the step down unit for further monitoring and treatment  Assessment/Plan: Principal Problem:  *ARF (acute renal failure) pre renal with tubular necrosis *Creatinine has steadily improved with rehydration and most recent reading after lunch time today is now down to 2.95. *Baseline creatinine in 2009 was 0.96 but normal GFR *FeNa calculated to be 1.64% and this appears to be consistent with ATN, likely from patient's concomitant use of his ACE inhibitor when dehydrated *BUN is elevated and this is consistent with prerenal etiology as well *Continue to monitor electrolyte panel *Has voided x1-we need to make sure patient has strict I & O so we can monitor urinary output volume *Followup on renal ultrasound result  Active Problems:  Hypotension/Dehydration *Resolving and we'll continue IV fluid hydration   Hyperkalemia/Hyponatremia *Secondary to dehydration and acute renal failure *We'll try to avoid giving Kayexalate since patient has underlying diarrhea *If potassium  is over 6 we'll need to repeat cardioprotective medications of insulin D50 and calcium gluconate and can consider a one-time dose of Kayexalate   Weight loss, non-intentional/Status post tooth extraction 3 weeks ago *Related to recent dental extraction and inability to eat as well as pain in now from attempts to wear dentures *Nutritional consultation *Begin supplements with resource beverage in nature putting   Abnormal TSH *TSH is low and likely reflective of sick euthyroid syndrome but will check free T4 and T3 as  precaution    Dementia   Diarrhea *Patient and daughter confirm he has not been on antibiotics with dental extraction or stent   DVT prophylaxis: SCDs Code Status: DO NOT RESUSCITATE Family Communication: Spoke with patient and his daughter Disposition Plan: Remain in stepdown  Consultants: None  Procedures: None  Antibiotics: None  HPI/Subjective: Patient alert and has flat affect. Eating clear liquids without difficulty. Denies pain but continues to endorse weakness.   Objective: Blood pressure 125/55, pulse 67, temperature 98.1 F (36.7 C), temperature source Oral, resp. rate 14, height 6' (1.829 m), weight 73 kg (160 lb 15 oz), SpO2 99.00%.  Intake/Output Summary (Last 24 hours) at 08/21/12 1515 Last data filed at 08/21/12 0746  Gross per 24 hour  Intake   1625 ml  Output      0 ml  Net   1625 ml     Exam: General: No acute respiratory distress Lungs: Clear to auscultation bilaterally without wheezes or crackles Cardiovascular: Regular rate and rhythm without murmur gallop or rub normal S1 and S2, the fluid at 125 cc per hour Abdomen: Nontender, nondistended, soft, bowel sounds positive, no rebound, no ascites, no appreciable mass Extremities: No significant cyanosis, clubbing,  or edema bilateral lower extremities  Data Reviewed: Basic Metabolic Panel:  Lab 08/21/12 0865 08/21/12 0440 08/21/12 0021 08/20/12 1958 08/20/12 1708  NA 136 137 135  -- 132*  K 5.5* 5.4* 5.0 -- 6.0*  CL 107 108 105 -- 101  CO2 19 16* 18* -- 18*  GLUCOSE 137* 97 135* -- 93  BUN 57* 68* 70* -- 78*  CREATININE 2.95* 4.07* 4.60* -- 5.53*  CALCIUM 8.3* 8.8 8.6 -- 8.8  MG -- 2.4 -- -- --  PHOS -- 4.7* -- 4.4 --   Liver Function Tests:  Lab 08/21/12 0440 08/20/12 1708  AST 15 15  ALT 15 18  ALKPHOS 64 68  BILITOT 0.4 0.3  PROT 6.0 6.5  ALBUMIN 3.5 3.9   No results found for this basename: LIPASE:5,AMYLASE:5 in the last 168 hours No results found for this basename: AMMONIA:5 in the last 168 hours CBC:  Lab 08/21/12 0440 08/20/12 1708  WBC 6.5 8.9  NEUTROABS -- 7.6  HGB 15.1 15.0  HCT 43.1 42.4  MCV 98.0 98.4  PLT 179 191   Cardiac Enzymes:  Lab 08/20/12 1958  CKTOTAL 83  CKMB 3.7  CKMBINDEX --  TROPONINI --   BNP (last 3 results) No results found for this basename: PROBNP:3 in the last 8760 hours CBG:  Lab 08/20/12 2011  GLUCAP 79    Recent Results (from the past 240 hour(s))  CULTURE, BLOOD (ROUTINE X 2)     Status: Normal (Preliminary result)   Collection Time   08/20/12  5:12 PM      Component Value Range Status Comment   Specimen Description BLOOD ARM RIGHT   Final    Special Requests BOTTLES DRAWN AEROBIC AND ANAEROBIC 10CC   Final    Culture  Setup Time 08/20/2012 22:48   Final    Culture     Final    Value:        BLOOD CULTURE RECEIVED NO GROWTH TO DATE CULTURE WILL BE HELD FOR 5 DAYS BEFORE ISSUING A FINAL NEGATIVE REPORT   Report Status PENDING   Incomplete   CULTURE, BLOOD (ROUTINE X 2)     Status: Normal (Preliminary result)   Collection Time   08/20/12  5:30 PM      Component Value Range Status Comment   Specimen Description BLOOD HAND RIGHT   Final    Special Requests BOTTLES DRAWN AEROBIC ONLY 8CC   Final    Culture  Setup Time 08/20/2012 22:46   Final    Culture     Final    Value:        BLOOD CULTURE RECEIVED NO GROWTH TO DATE CULTURE WILL BE HELD FOR 5 DAYS BEFORE ISSUING A FINAL NEGATIVE REPORT    Report Status PENDING   Incomplete   MRSA PCR SCREENING     Status: Normal   Collection Time   08/20/12 11:10 PM      Component Value Range Status Comment   MRSA by PCR NEGATIVE  NEGATIVE Final   STOOL CULTURE     Status: Normal (Preliminary result)   Collection Time   08/21/12  6:17 AM      Component Value Range Status Comment   Specimen Description STOOL   Final    Special Requests Normal   Final    Culture Culture reincubated for better growth   Final    Report Status PENDING   Incomplete   CLOSTRIDIUM DIFFICILE BY PCR     Status: Normal  Collection Time   08/21/12  6:17 AM      Component Value Range Status Comment   C difficile by pcr NEGATIVE  NEGATIVE Final      Studies:  Recent x-ray studies have been reviewed in detail by the Attending Physician  Scheduled Meds:  Reviewed in detail by the Attending Physician   Junious Silk, ANP [facility] Office  [phone] Pager [phone]  On-Call/Text Page:      [email or web address]      password [email or web address]  If 7PM-7AM, please contact night-coverage [email or web address] Password [facility] 08/21/2012, 3:15 PM   LOS: 1 day   I have examined the patient and reviewed the chart. I agree with the above note which I have modified.   Calvert Cantor, MD [phone]

## 2012-08-21 NOTE — Progress Notes (Signed)
Utilization review completed.  

## 2012-08-21 NOTE — Progress Notes (Signed)
INITIAL ADULT NUTRITION ASSESSMENT Date: 08/21/2012   Time: 3:10 PM  Reason for Assessment: MD Consult for assessment of nutrition status; Nutrition Risk (MST=3)  INTERVENTION:  Discontinue Resource [employer] Complete PO TID, each supplement provides 350 kcal and 13 grams of protein.  Continue Ensure Pudding TID  DOCUMENTATION CODES Per approved criteria  -Severe malnutrition in the context of acute illness or injury   ASSESSMENT: Male 71 y.o.  Dx: ARF (acute renal failure) with tubular necrosis  Hx:  Past Medical History  Diagnosis Date  . Dementia   . Hypertension    Past Surgical History  Procedure Date  . Dental surgery   . Hand surgery     Related Meds:  Scheduled Meds:   . calcium gluconate 1 GM IV  1 g Intravenous Once  . citalopram  40 mg Oral Daily  . dextrose  1 ampule Intravenous Once  . donepezil  10 mg Oral QHS  . feeding supplement  1 Container Oral TID BM  . feeding supplement  1 Container Oral TID BM  . insulin aspart  10 Units Intravenous Once  . lactated ringers  1,000 mL Intravenous Once  . levothyroxine  150 mcg Oral Daily  . pantoprazole  40 mg Oral Q1200  . general admission iv infusion   Intravenous Once  . sodium chloride  1,000 mL Intravenous Once  . sodium chloride  1,000 mL Intravenous Once  . sodium chloride  3 mL Intravenous Q12H   Continuous Infusions:   . sodium chloride    . dextrose 5% lactated ringers    . DISCONTD: dextrose 5% lactated ringers 125 mL/hr at 08/20/12 1852   PRN Meds:.acetaminophen, acetaminophen, albuterol, guaiFENesin-dextromethorphan, HYDROcodone-acetaminophen, ondansetron (ZOFRAN) IV, ondansetron   Ht: 6' (182.9 cm)  Wt: 160 lb 15 oz (73 kg)  Ideal Wt: 80.9 kg % Ideal Wt: 90%  Wt Readings from Last 10 Encounters:  08/20/12 160 lb 15 oz (73 kg)   Usual Wt: 175 lb % Usual Wt: 91%  Body mass index is 21.83 kg/(m^2).  Food/Nutrition Related Hx: 3 weeks history of diarrhea poor PO intake.  He have had dental surgeries done to remove all of his lower teeth due to decay. He was not able to eat except fluid food and he has not been consuming enough.   Labs:  CMP     Component Value Date/Time   NA 136 08/21/2012 1344   K 5.5* 08/21/2012 1344   CL 107 08/21/2012 1344   CO2 19 08/21/2012 1344   GLUCOSE 137* 08/21/2012 1344   BUN 57* 08/21/2012 1344   CREATININE 2.95* 08/21/2012 1344   CALCIUM 8.3* 08/21/2012 1344   PROT 6.0 08/21/2012 0440   ALBUMIN 3.5 08/21/2012 0440   AST 15 08/21/2012 0440   ALT 15 08/21/2012 0440   ALKPHOS 64 08/21/2012 0440   BILITOT 0.4 08/21/2012 0440   GFRNONAA 20* 08/21/2012 1344   GFRAA 23* 08/21/2012 1344    CBG (last 3)   Basename 08/20/12 2011  GLUCAP 79    Sodium  Date/Time Value Range Status  08/21/2012  1:44 PM 136  135 - 145 mEq/L Final  08/21/2012  4:40 AM 137  135 - 145 mEq/L Final  08/21/2012 12:21 AM 135  135 - 145 mEq/L Final    Potassium  Date/Time Value Range Status  08/21/2012  1:44 PM 5.5* 3.5 - 5.1 mEq/L Final  08/21/2012  4:40 AM 5.4* 3.5 - 5.1 mEq/L Final  08/21/2012 12:21 AM 5.0  3.5 - 5.1 mEq/L Final    Phosphorus  Date/Time Value Range Status  08/21/2012  4:40 AM 4.7* 2.3 - 4.6 mg/dL Final  12/19/6043  4:09 PM 4.4  2.3 - 4.6 mg/dL Final    Magnesium  Date/Time Value Range Status  08/21/2012  4:40 AM 2.4  1.5 - 2.5 mg/dL Final     Intake/Output Summary (Last 24 hours) at 08/21/12 1512 Last data filed at 08/21/12 0746  Gross per 24 hour  Intake   1625 ml  Output      0 ml  Net   1625 ml    Diet Order: Regular diet  Supplements: Ensure Pudding TID, Resource Breeze TID  IVF:    sodium chloride   dextrose 5% lactated ringers   DISCONTD: dextrose 5% lactated ringers Last Rate: 125 mL/hr at 08/20/12 1852    Estimated Nutritional Needs:   Kcal: 1850-2050 Protein: 95-110 gm Fluid: 1.9-2.1 liters  Patient with 9% weight loss in the past month.  Had dental surgery and does not have any bottom teeth or dentures.   Says he is scared to try much more than liquids.  Intake over the past several weeks has been minimal, just drinking liquids.  Drinks Boost supplement sometimes.  Pt meets criteria for severe malnutrition in the context of acute illness as evidenced by 9% weight loss in 1 month and intake < 50% of estimated nutrition requirement for > 5 days.   NUTRITION DIAGNOSIS: -Malnutrition (NI-5.2).  Status: Ongoing  RELATED TO: suboptimal oral intake  AS EVIDENCED BY: 9% weight loss in the past month and intake < 50% of estimated nutrition requirement for > 5 days  MONITORING/EVALUATION(Goals): Goal:  Intake to meet >90% of estimated nutrition needs. Monitor:  PO intake, weight trend, labs.  EDUCATION NEEDS: -Education needs addressed; discussed ways to increase protein and calorie intake.   Joaquin Courts, RD, LDN, CNSC Pager# [phone] After Hours Pager# [phone]  08/21/2012, 3:10 PM

## 2012-08-22 LAB — BASIC METABOLIC PANEL
BUN: 40 mg/dL — ABNORMAL HIGH (ref 6–23)
CO2: 20 mEq/L (ref 19–32)
Calcium: 8.2 mg/dL — ABNORMAL LOW (ref 8.4–10.5)
Creatinine, Ser: 1.86 mg/dL — ABNORMAL HIGH (ref 0.50–1.35)
Glucose, Bld: 94 mg/dL (ref 70–99)

## 2012-08-22 LAB — FECAL LACTOFERRIN, QUANT: Fecal Lactoferrin: POSITIVE

## 2012-08-22 NOTE — Progress Notes (Signed)
08/22/2012 3:58 PM Pt arrived to unit via wheelchair and accompanied by daughter.  Pt lives at home alone but next door to his daughter, which provides him great support.  Pt alert and oriented, ambulatory, no complaints of pain.  Skin intact.  Pt is a moderate fall risk, score 8.  Pt and family educated on fall risk score and necessary interventions, to which they verbalized understanding.  Pt oriented to room/unit, and were instructed on how to utilize the call bell, to which they verablized understanding.  Vitals stable.  Will continue to monitor patient. Eunice Blase

## 2012-08-22 NOTE — Progress Notes (Signed)
Nutrition Follow-up  Intervention:   1.  Meals/snacks; pt plans to continue Ensure Complete or Pudding as snacks and will work on chewing efforts at meals.  Encouraged pt to request diet downgrade if his ability to eat does not improve with soft foods at lunch.   RD ordered preferred foods for pt at lunch and stated availability of RD to meet with pt/family over the weekend.  Assessment:   RD notified by RN while on unit that daughter had additional questions after meeting with unit RD yesterday. Pt is still having trouble finding foods he can tolerate.  Pt states that he tried to eat a hot dog last night, however he was unable to chew it.  He continues to consume liquids- soup, jello, milk, etc...  He is planning to remove his top dentures this afternoon for lunch to see if he can better "gum" his food.  RD discussed options including soft foods available to reduce the need for chewing, as well as texture modification.  Pt would like to try soft foods for lunch and reserve the right to downgrade his diet to PUREED OR GROUND MEATS if needed.   Pt states he has a very small appetite and typically grazes throughout they day, never eating a regularly-sized adult meal in one sitting. RD discussed the role of nutrition and it's importance for healing and promoting health.  After meeting with pt and daughter, daughters requests to meet with RD alone.  She conveys that has some anxiety related eating and that current issues with teeth/mouth pain are pt's latest "excuse" not to eat.  She does empathize that pt is currently having legitimate mouth pain, however is concerned about nutrition status and wants his intake closely monitoring during stay.  She reports pt does not eat well and has expressed fear of weight gain in the past.  She reports his lowest wt was 136 lbs at which point he was admitted to a nursing facility for rehab.  She is afraid that pt will continue to restrict intake to promote wt loss.  She  also feels pt's dementia is contributing to poor intake.  Pt tends to eat the same foods repeatedly until he tires of them, and then doesn't eat because he doesn't want what is available in the house.    RD did not discuss wt concerns with pt.  Instead promoted the benefits of nutrition and consumption at this time.  Discussed his frustrations with current diet and intake- causing diarrhea, feels he is getting up to the bathroom a lot, etc.  Discussed some reasons as to why his current nutrition status and intake may be contributing to loose stools and frequent urination- including caffeine intake, liquid diet, etc.. And how improving his solid food intake may help to alleviate some of these frustrations.    Pt continues to meet criteria for severe malnutrition in the context of acute illness due to 9% wt loss in 1 month and intake <50% of estimated needs for >5 days.  Diet Order:  Regular  Meds: Scheduled Meds:   . citalopram  40 mg Oral Daily  . donepezil  10 mg Oral QHS  . feeding supplement  237 mL Oral TID BM  . feeding supplement  1 Container Oral TID BM  . levothyroxine  150 mcg Oral Daily  . pantoprazole  40 mg Oral Q1200  . general admission iv infusion   Intravenous Once  . sodium chloride  3 mL Intravenous Q12H  . DISCONTD: feeding  supplement  1 Container Oral TID BM   Continuous Infusions:   . sodium chloride 125 mL/hr at 08/22/12 0700  . DISCONTD: dextrose 5% lactated ringers 125 mL/hr at 08/21/12 1531   PRN Meds:.acetaminophen, acetaminophen, albuterol, guaiFENesin-dextromethorphan, HYDROcodone-acetaminophen, ondansetron (ZOFRAN) IV, ondansetron  Labs:  CMP     Component Value Date/Time   NA 141 08/22/2012 0829   K 5.2* 08/22/2012 0829   CL 114* 08/22/2012 0829   CO2 20 08/22/2012 0829   GLUCOSE 94 08/22/2012 0829   BUN 40* 08/22/2012 0829   CREATININE 1.86* 08/22/2012 0829   CALCIUM 8.2* 08/22/2012 0829   PROT 6.0 08/21/2012 0440   ALBUMIN 3.5 08/21/2012 0440   AST 15  08/21/2012 0440   ALT 15 08/21/2012 0440   ALKPHOS 64 08/21/2012 0440   BILITOT 0.4 08/21/2012 0440   GFRNONAA 35* 08/22/2012 0829   GFRAA 40* 08/22/2012 0829     Intake/Output Summary (Last 24 hours) at 08/22/12 1125 Last data filed at 08/22/12 0500  Gross per 24 hour  Intake    240 ml  Output   1250 ml  Net  -1010 ml    Weight Status:  168 lbs via bedscale  Re-estimated needs:  1850-2050 kcal, 95-110g, 1.9-2.1 L/day  Nutrition Dx:  Malnutrition, ongoing  Monitor:  Goal: Intake to meet >90% of estimated nutrition needs.  Monitor: PO intake, weight trend, labs.  Loyce Dys, MS RD LDN Clinical Inpatient Dietitian Pager: [phone] Weekend/After hours pager: [phone]

## 2012-08-22 NOTE — Progress Notes (Signed)
[facility] Progress Note [place] TEAM 1 - Stepdown/ICU TEAM   Michael Weaver:[id number] DOB: October 07, 1941 DOA: 08/20/2012 PCP: Bradd Burner, PA  Brief narrative: 71 year old male patient with underlying history of dementia and hypertension. Patient presented to the emergency department after his daughter discovered his pressure was low at 83 systolic. This patient had recently undergone extensive dental extraction 3 weeks prior and his diet has consisted of coffee cranberry juice, V8 juice. In addition he was having watery stools since the teeth extraction. In the emergency department he was found to be hypotensive but this did respond to IV fluids. He did not appear to have any criteria for sepsis. He was also found to have acute renal failure with hyperkalemia with a creatinine of 5.53. He was started on IV fluids and due to the degree of his renal failure with associated hyperkalemia he was admitted to the step down unit for further monitoring and treatment  Assessment/Plan: Principal Problem:  *ARF (acute renal failure) pre renal with tubular necrosis *Creatinine continues to trend down-today is 1.86 *Baseline creatinine in 2009 was 0.96 but normal GFR *FeNa calculated on 08/21/2012 to be 1.64% and this appears to be consistent with ATN, likely from patient's concomitant use of his ACE inhibitor when dehydrated *BUN is elevated and this is consistent with prerenal etiology as well *Continue to monitor electrolyte panel *Excellent urinary output over the past 24 hours with 1250 cc *Renal ultrasound for obstruction or hydronephrosis  Active Problems:  Hypotension/Dehydration *Resolving and we'll continue IV fluid hydration at 125 cc per hour since in the high-output phase of ATN   Hyperkalemia/Hyponatremia *Secondary to dehydration and acute renal failure *We'll try to avoid giving Kayexalate since patient has underlying diarrhea *If potassium is over 6 we'll need to  repeat cardioprotective medications of insulin D50 and calcium gluconate and can consider a one-time dose of Kayexalate   Weight loss, non-intentional/Status post tooth extraction 3 weeks ago *Related to recent dental extraction and inability to eat as well as pain in now from attempts to wear dentures *Nutritional consultation *Begin supplements with resource beverage and Ensure beverage or pudding *Cannot tolerate regular food do to dental pain therefore we'll switch to a mechanical soft diet   Abnormal TSH *TSH is low and likely reflective of sick euthyroid syndrome but will check free T4 and T3 as  precaution    Dementia   Diarrhea *Patient and daughter confirm he has not been on antibiotics with dental extraction or stent   DVT prophylaxis: SCDs Code Status: DO NOT RESUSCITATE Family Communication: Spoke with patient and his daughter Disposition Plan: Transfer to floor  Consultants: None  Procedures: None  Antibiotics: None  HPI/Subjective: Much more alert and animated today and apparently at his baseline. Endorses continues to have dental pain with or without dentures in place with attempts to eat. No other complaints verbalized.   Objective: Blood pressure 104/57, pulse 71, temperature 98 F (36.7 C), temperature source Oral, resp. rate 20, height 6' (1.829 m), weight 73 kg (160 lb 15 oz), SpO2 99.00%.  Intake/Output Summary (Last 24 hours) at 08/22/12 1339 Last data filed at 08/22/12 1210  Gross per 24 hour  Intake   2365 ml  Output   1150 ml  Net   1215 ml     Exam: General: No acute respiratory distress Lungs: Clear to auscultation bilaterally without wheezes or crackles Cardiovascular: Regular rate and rhythm without murmur gallop or rub normal S1 and S2, the fluid at 125  cc per hour Abdomen: Nontender, nondistended, soft, bowel sounds positive, no rebound, no ascites, no appreciable mass Extremities: No significant cyanosis, clubbing, or edema  bilateral lower extremities  Data Reviewed: Basic Metabolic Panel:  Lab 08/22/12 1610 08/21/12 2204 08/21/12 1344 08/21/12 0440 08/21/12 0021 08/20/12 1958  NA 141 139 136 137 135 --  K 5.2* 5.7* 5.5* 5.4* 5.0 --  CL 114* 110 107 108 105 --  CO2 20 18* 19 16* 18* --  GLUCOSE 94 78 137* 97 135* --  BUN 40* 49* 57* 68* 70* --  CREATININE 1.86* 2.39* 2.95* 4.07* 4.60* --  CALCIUM 8.2* 8.4 8.3* 8.8 8.6 --  MG -- -- -- 2.4 -- --  PHOS -- -- -- 4.7* -- 4.4   Liver Function Tests:  Lab 08/21/12 0440 08/20/12 1708  AST 15 15  ALT 15 18  ALKPHOS 64 68  BILITOT 0.4 0.3  PROT 6.0 6.5  ALBUMIN 3.5 3.9   No results found for this basename: LIPASE:5,AMYLASE:5 in the last 168 hours No results found for this basename: AMMONIA:5 in the last 168 hours CBC:  Lab 08/21/12 0440 08/20/12 1708  WBC 6.5 8.9  NEUTROABS -- 7.6  HGB 15.1 15.0  HCT 43.1 42.4  MCV 98.0 98.4  PLT 179 191   Cardiac Enzymes:  Lab 08/20/12 1958  CKTOTAL 83  CKMB 3.7  CKMBINDEX --  TROPONINI --   BNP (last 3 results) No results found for this basename: PROBNP:3 in the last 8760 hours CBG:  Lab 08/20/12 2011  GLUCAP 79    Recent Results (from the past 240 hour(s))  CULTURE, BLOOD (ROUTINE X 2)     Status: Normal (Preliminary result)   Collection Time   08/20/12  5:12 PM      Component Value Range Status Comment   Specimen Description BLOOD ARM RIGHT   Final    Special Requests BOTTLES DRAWN AEROBIC AND ANAEROBIC 10CC   Final    Culture  Setup Time 08/20/2012 22:48   Final    Culture     Final    Value:        BLOOD CULTURE RECEIVED NO GROWTH TO DATE CULTURE WILL BE HELD FOR 5 DAYS BEFORE ISSUING A FINAL NEGATIVE REPORT   Report Status PENDING   Incomplete   CULTURE, BLOOD (ROUTINE X 2)     Status: Normal (Preliminary result)   Collection Time   08/20/12  5:30 PM      Component Value Range Status Comment   Specimen Description BLOOD HAND RIGHT   Final    Special Requests BOTTLES DRAWN AEROBIC ONLY  8CC   Final    Culture  Setup Time 08/20/2012 22:46   Final    Culture     Final    Value:        BLOOD CULTURE RECEIVED NO GROWTH TO DATE CULTURE WILL BE HELD FOR 5 DAYS BEFORE ISSUING A FINAL NEGATIVE REPORT   Report Status PENDING   Incomplete   MRSA PCR SCREENING     Status: Normal   Collection Time   08/20/12 11:10 PM      Component Value Range Status Comment   MRSA by PCR NEGATIVE  NEGATIVE Final   STOOL CULTURE     Status: Normal (Preliminary result)   Collection Time   08/21/12  6:17 AM      Component Value Range Status Comment   Specimen Description STOOL   Final    Special Requests Normal  Final    Culture Culture reincubated for better growth   Final    Report Status PENDING   Incomplete   CLOSTRIDIUM DIFFICILE BY PCR     Status: Normal   Collection Time   08/21/12  6:17 AM      Component Value Range Status Comment   C difficile by pcr NEGATIVE  NEGATIVE Final      Studies:  Recent x-ray studies have been reviewed in detail by the Attending Physician  Scheduled Meds:  Reviewed in detail by the Attending Physician   Junious Silk, ANP [facility] Office  [phone] Pager [phone]  On-Call/Text Page:      [email or web address]      password [email or web address]  If 7PM-7AM, please contact night-coverage [email or web address] Password [email or web address] 08/22/2012, 1:39 PM   LOS: 2 days   I have examined the patient and reviewed the chart. I have modified the above note and agree with it.   Calvert Cantor, MD [phone]

## 2012-08-23 DIAGNOSIS — N179 Acute kidney failure, unspecified: Secondary | ICD-10-CM

## 2012-08-23 LAB — CBC
MCH: 34.2 pg — ABNORMAL HIGH (ref 26.0–34.0)
MCV: 98.1 fL (ref 78.0–100.0)
Platelets: 161 10*3/uL (ref 150–400)
RDW: 12.1 % (ref 11.5–15.5)

## 2012-08-23 LAB — RENAL FUNCTION PANEL
Albumin: 3.1 g/dL — ABNORMAL LOW (ref 3.5–5.2)
CO2: 20 mEq/L (ref 19–32)
Calcium: 8 mg/dL — ABNORMAL LOW (ref 8.4–10.5)
Creatinine, Ser: 1.44 mg/dL — ABNORMAL HIGH (ref 0.50–1.35)
GFR calc non Af Amer: 47 mL/min — ABNORMAL LOW (ref >90)

## 2012-08-23 MED ORDER — CITALOPRAM HYDROBROMIDE 40 MG PO TABS: 20.0000 mg | ORAL_TABLET | Freq: Every day | ORAL | Status: AC

## 2012-08-23 NOTE — Discharge Summary (Addendum)
Physician Discharge Summary  Patient ID: Michael Weaver MRN: [id number] DOB/AGE: [birth year] 71 y.o.  Admit date: 08/20/2012 Discharge date: 08/23/2012  Primary Care Physician:  Bradd Burner, PA   Discharge Diagnoses:    Principal Problem:  *ARF (acute renal failure) pre renal with tubular necrosis-improved Active Problems:  Hypotension-resolved  Dementia  Dehydration  Diarrhea  Hyperkalemia  Hyponatremia  Hypertension  Weight loss, non-intentional  Status post tooth extraction 3 weeks ago  Abnormal TSH      Medication List     As of 08/23/2012  3:02 PM    STOP taking these medications         lisinopril 10 MG tablet   Commonly known as: PRINIVIL,ZESTRIL      TAKE these medications         cholecalciferol 1000 UNITS tablet   Commonly known as: VITAMIN D   Take 1,000 Units by mouth daily.      citalopram 40 MG tablet   Commonly known as: CELEXA   Take 0.5 tablets (20 mg total) by mouth daily.      donepezil 10 MG tablet   Commonly known as: ARICEPT   Take 10 mg by mouth at bedtime.      levothyroxine 150 MCG tablet   Commonly known as: SYNTHROID, LEVOTHROID   Take 150 mcg by mouth daily.      Melatonin 1 MG Tabs   Take 1 tablet by mouth at bedtime.      prenatal multivitamin Tabs   Take 1 tablet by mouth daily.         Disposition and Follow-up:  PCP in 1 week   Significant Diagnostic Studies:  RENAL/URINARY TRACT ULTRASOUND COMPLETE IMPRESSION: Simple cysts in the kidneys. No evidence of hydronephrosis to suggest obstruction.  CHEST - 2 VIEW IMPRESSION: No active cardiopulmonary disease.    Brief H and P: Michael Weaver is a 71 y.o. male  has a past medical history of Dementia and Hypertension.  Presented with  3 weeks history of diarrhea poor PO intake. He have had dental surgeries done to remove all of his lower teeth due to decay. He was not able to eat except fluid food and he has not been consuming enough. He started to  feel light headed.  Once he started to have only liquid diet he began to have diarrhea. His diet consisted of coffee, cranberry juice and V8.  His urine has become dark and he had decreased urine output he is not sure if he was even urinating at all for the past few days. He denies getting any antibiotics. NO nausea, no vomiting no fever or chills.  He lives at home but next door to his daughter. Today she stopped over to check on him and checked his blood pressure that was found to be very low with SBP of 83 at this point he was brought in to ER    Hospital Course:   *ARF (acute renal failure) pre renal with tubular necrosis  *Creatinine continues to trend down-today is 1.4 today, it was up as high as 5.5 on admission. *Baseline creatinine in 2009 was 0.96 but normal GFR  *FeNa calculated on 08/21/2012 to be 1.64% and this appears to be consistent with ATN, likely from patient's concomitant use of his ACE inhibitor when dehydrated  ACE inhibitors discontinued *Renal ultrasound without obstruction or hydronephrosis  Hypotension/Dehydration  *Resolved with IVF rehydration, felt to be related to volume depletion from diarrhea   Hyperkalemia/Hyponatremia  *Secondary to  dehydration and acute renal failure and ACE *treated and since corrected to normal range  Weight loss, non-intentional/Status post tooth extraction 3 weeks ago  *Related to recent dental extraction and inability to eat as well as pain in now from attempts to wear dentures  For removal of stitches early next week *Nutritional consultation  *Begin supplements with resource beverage and Ensure beverage or pudding  *Cannot tolerate regular food do to dental pain therefore we'll switch to a mechanical soft diet   Abnormal TSH  *TSH is low and likely reflective of sick euthyroid syndrome, T4/T3 normal   Dementia : stable, continue aricept  Diarrhea : resolved, Cdiff negative  Time spent on  Discharge:  Signed: Bristyn Kulesza [facility]  08/23/2012, 3:02 PM

## 2012-08-23 NOTE — Progress Notes (Signed)
Patient discharge home, discharge instruction given and explained to patient.  Copies of all forms given.  Patient voiced understanding of all instructions.

## 2012-08-23 NOTE — Progress Notes (Signed)
Physical Therapy Evaluation Patient Details Name: Michael Weaver MRN: [id number] DOB: 09-Aug-1941 Today's Date: 08/23/2012 Time: 4098-1191 PT Time Calculation (min): 10 min  PT Assessment / Plan / Recommendation Clinical Impression  71 yo M presents at baseline level of function which daughter confirms.  No further PT needs.      PT Assessment  Patent does not need any further PT services    Follow Up Recommendations  No PT follow up    Barriers to Discharge        Equipment Recommendations  None recommended by PT    Recommendations for Other Services     Frequency      Precautions / Restrictions     Pertinent Vitals/Pain Denies pain      Mobility  Bed Mobility Bed Mobility: Supine to Sit Supine to Sit: 7: Independent Transfers Transfers: Sit to Stand;Stand to Sit Sit to Stand: 7: Independent Stand to Sit: 7: Independent Ambulation/Gait Ambulation/Gait Assistance: 7: Independent Ambulation Distance (Feet): 250 Feet Assistive device: None Ambulation/Gait Assistance Details: no loss of balance while ambulating Gait Pattern: Within Functional Limits Gait velocity: WNL Stairs: No    Exercises     PT Diagnosis:    PT Problem List:   PT Treatment Interventions:     PT Goals    Visit Information  Last PT Received On: 08/23/12 Assistance Needed: +1    Subjective Data  Subjective: Pt states he is ready to go home Patient Stated Goal: to go home today   Prior Functioning  Home Living Lives With: Alone Available Help at Discharge: Family;Other (Comment) (Daughter lives next door) Type of Home: House Home Layout: One level Home Adaptive Equipment: None Prior Function Level of Independence: Independent (for basic needs) Communication Communication: No difficulties    Cognition  Overall Cognitive Status: History of cognitive impairments - at baseline Arousal/Alertness: Awake/alert Orientation Level: Appears intact for tasks assessed Behavior During  Session: [facility] for tasks performed    Extremity/Trunk Assessment Right Upper Extremity Assessment RUE ROM/Strength/Tone: Within functional levels Left Upper Extremity Assessment LUE ROM/Strength/Tone: Within functional levels Right Lower Extremity Assessment RLE ROM/Strength/Tone: Within functional levels Left Lower Extremity Assessment LLE ROM/Strength/Tone: Within functional levels   Balance Balance Balance Assessed: Yes Dynamic Standing Balance Dynamic Standing - Balance Support: No upper extremity supported Dynamic Standing - Level of Assistance: 7: Independent Dynamic Standing - Balance Activities: Other (comment) (picking objects off floor, turning in a circle)  End of Session PT - End of Session Equipment Utilized During Treatment: Gait belt Activity Tolerance: Patient tolerated treatment well Patient left: in chair;with family/visitor present Nurse Communication: Mobility status;Other (comment) (Pt ready for DC)  GP     Donnella Sham 08/23/2012, 3:42 PM Lavona Mound, PT  [phone] 08/23/2012

## 2012-08-24 LAB — STOOL CULTURE: Special Requests: NORMAL

## 2012-08-26 LAB — CULTURE, BLOOD (ROUTINE X 2)

## 2013-07-19 IMAGING — CR DG CHEST 2V
2 series · 2 of 2 positions shown · non-contrast
Comparison: 08/23/2008.

CLINICAL DATA: Hypotension, weakness.

CHEST - 2 VIEW

[w chest pa]
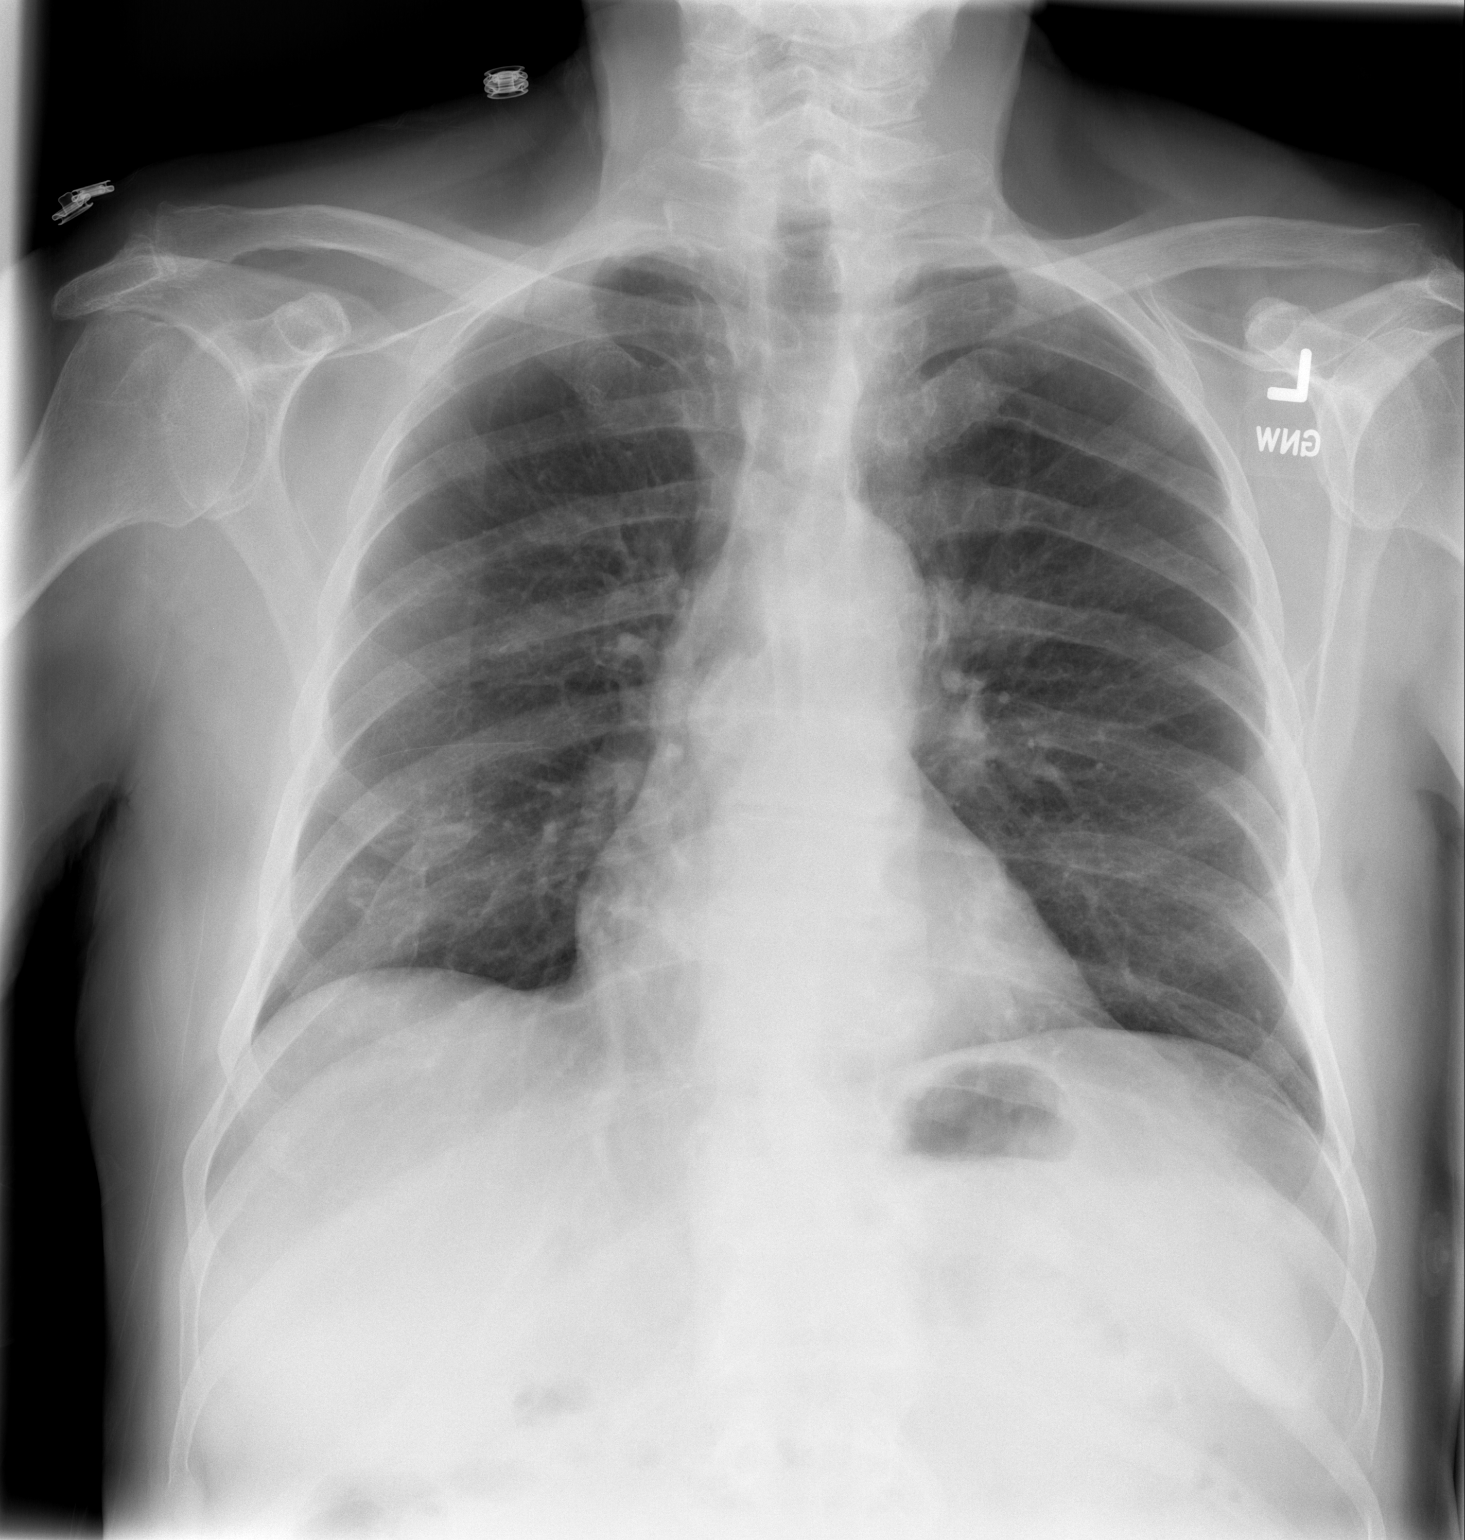

[w chest lat]
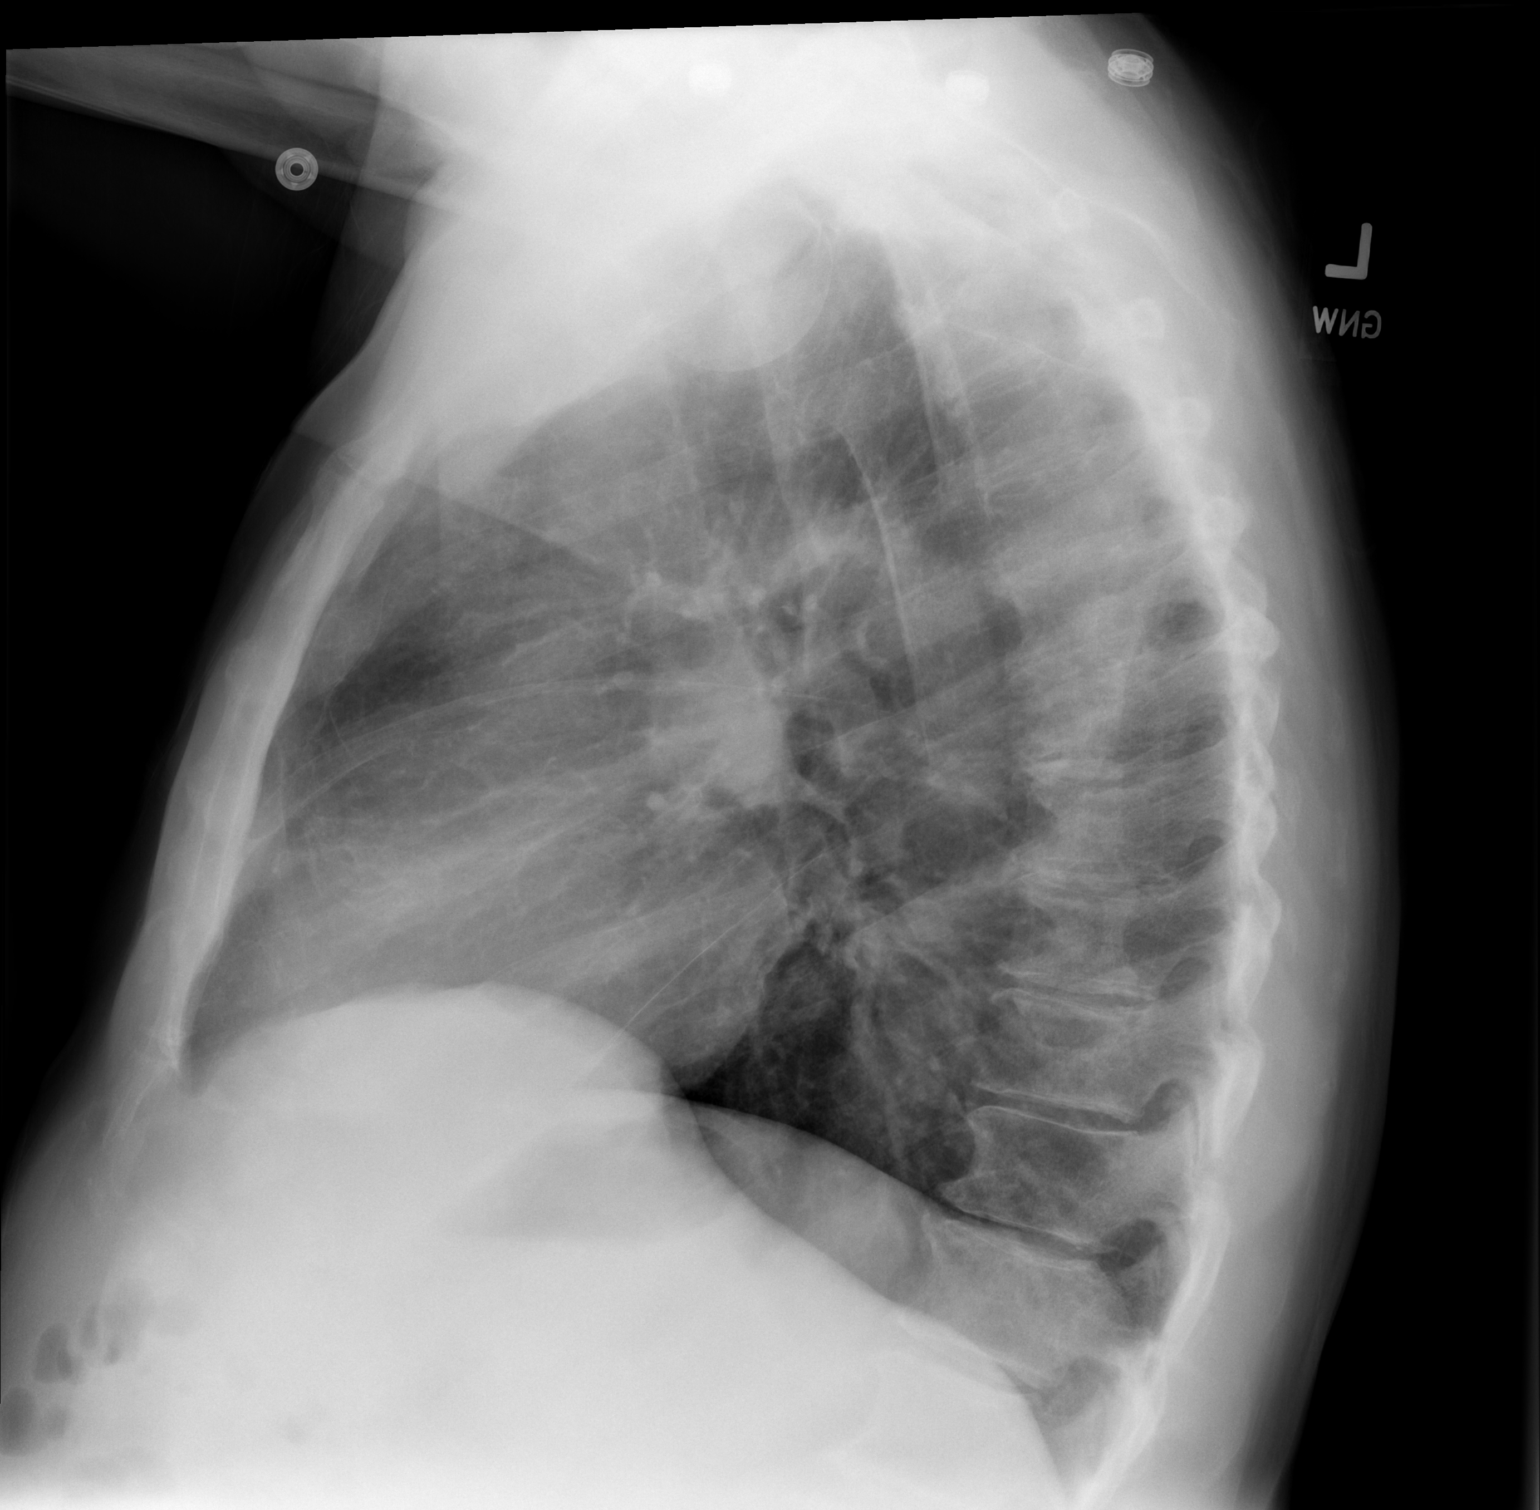

[2 of 2 positions shown; findings below may reference images not displayed]

FINDINGS: Heart and mediastinal contours are within normal limits.
No focal opacities or effusions.  No acute bony abnormality.  Old
right-sided rib fractures.
IMPRESSION: No active cardiopulmonary disease.

## 2013-07-20 IMAGING — US US RENAL
1 series · 14 of 25 positions shown · non-contrast
Comparison: CT 05/28/2006

CLINICAL DATA: Renal failure

RENAL/URINARY TRACT ULTRASOUND COMPLETE

[Series 1: us renal · 0.25mm/px · 14 of 40 slices shown]
[im 1/40]
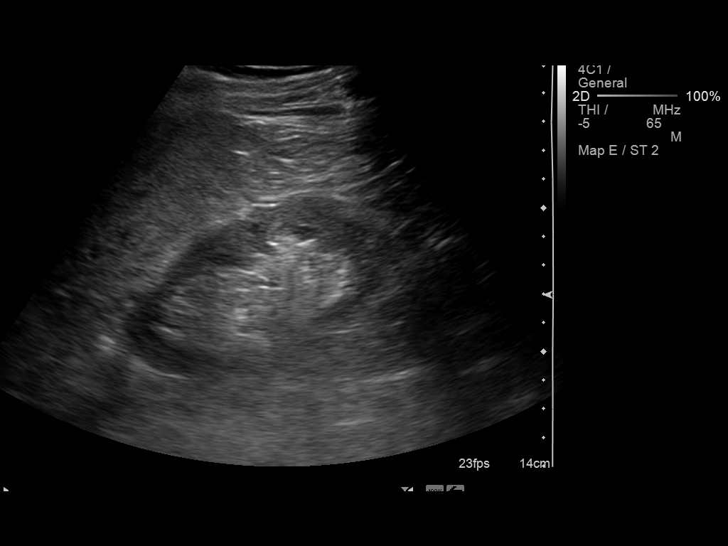
[im 4/40]
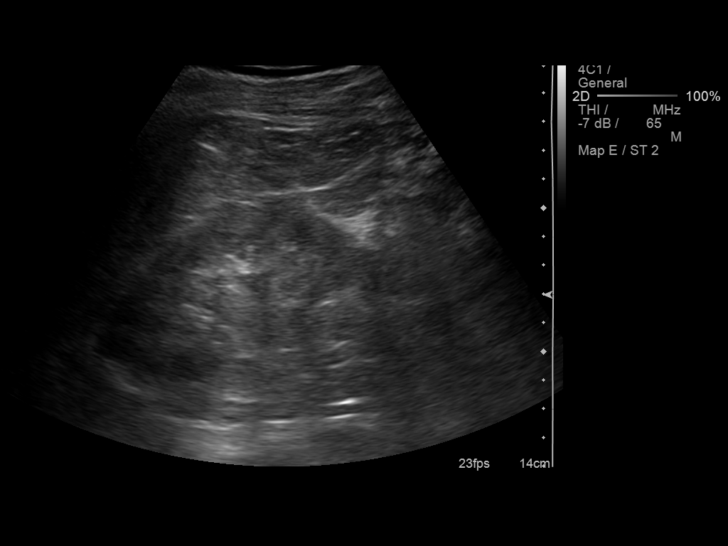
[im 7/40]
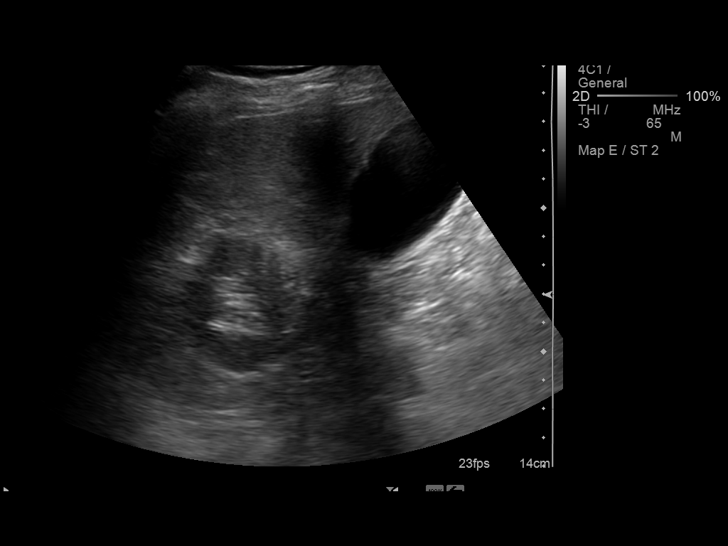
[im 10/40]
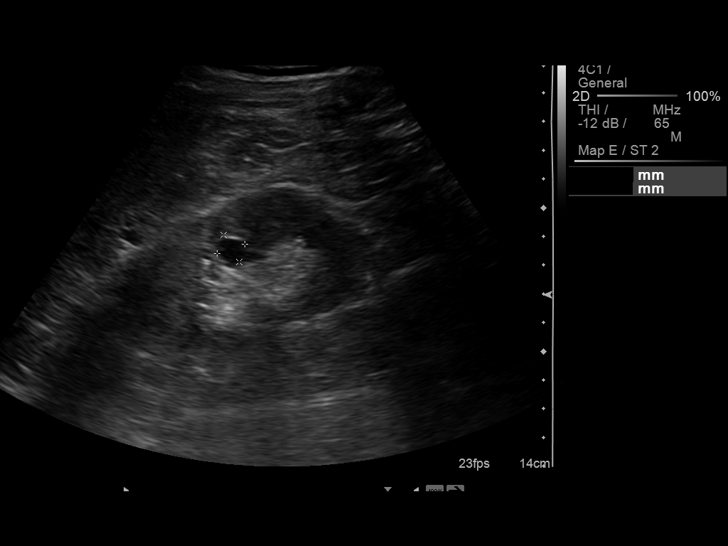
[im 14/40]
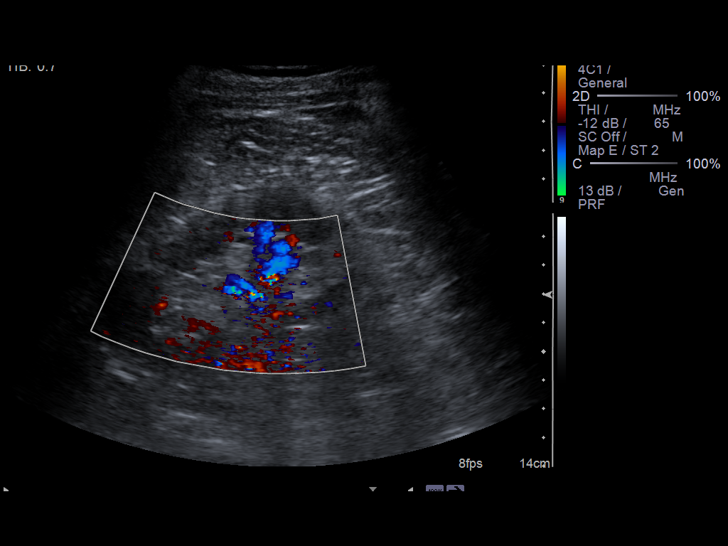
[im 15/40]
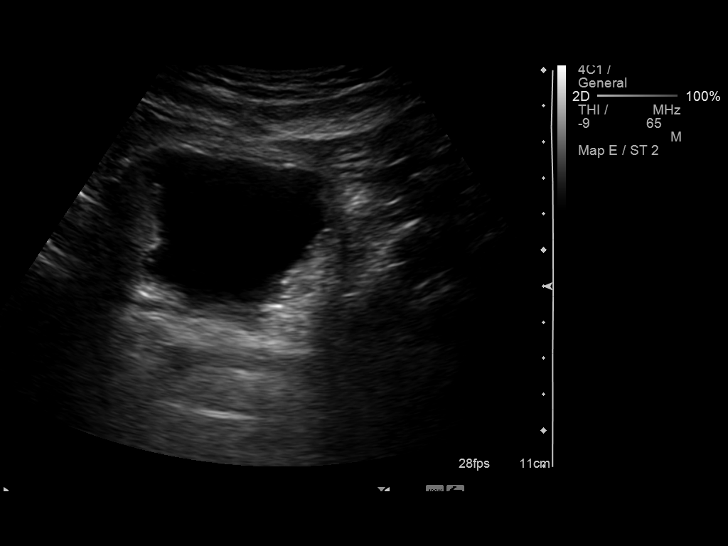
[im 18/40]
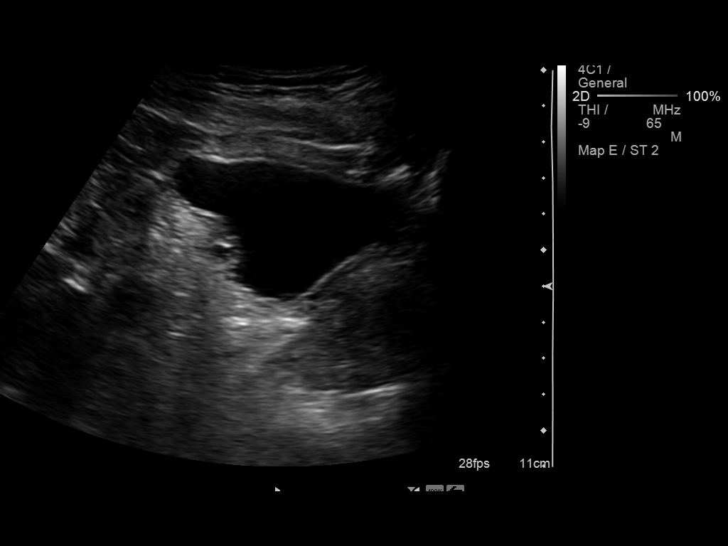
[im 22/40]
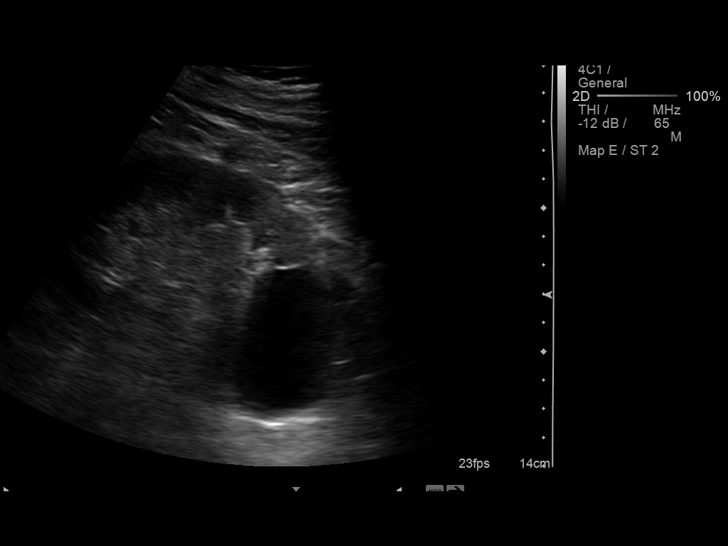
[im 25/40]
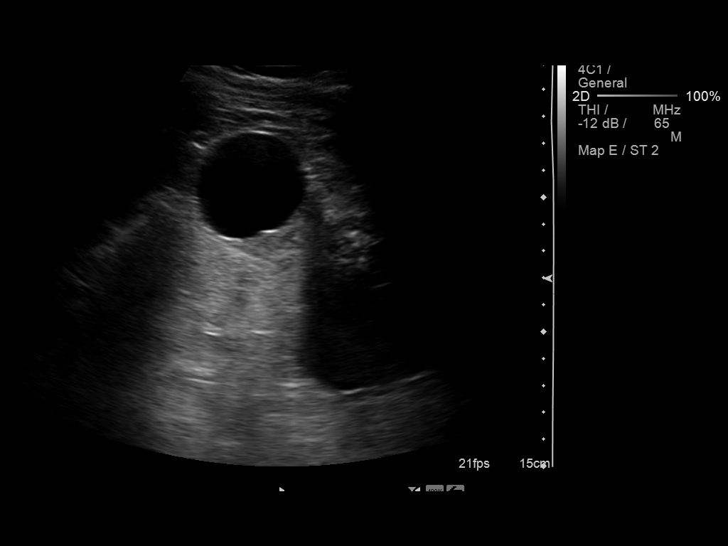
[im 27/40]
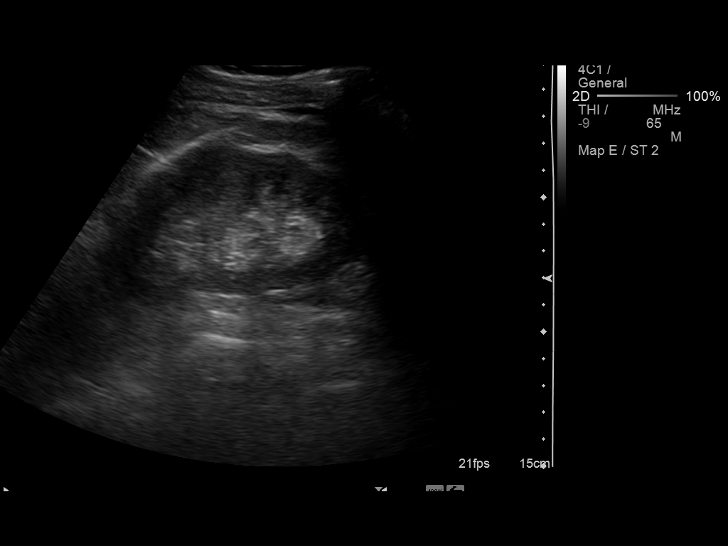
[im 30/40]
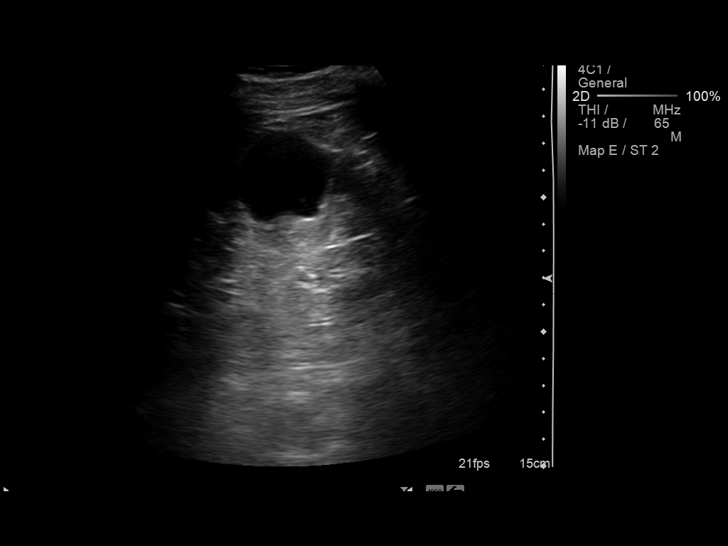
[im 33/40]
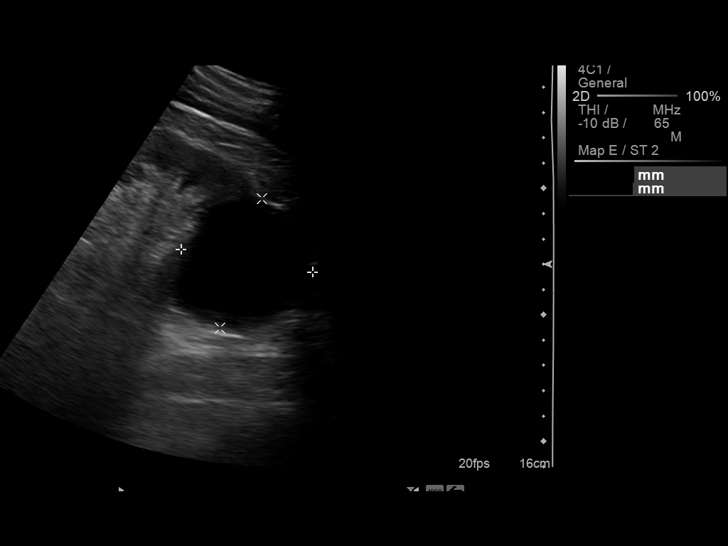
[im 36/40]
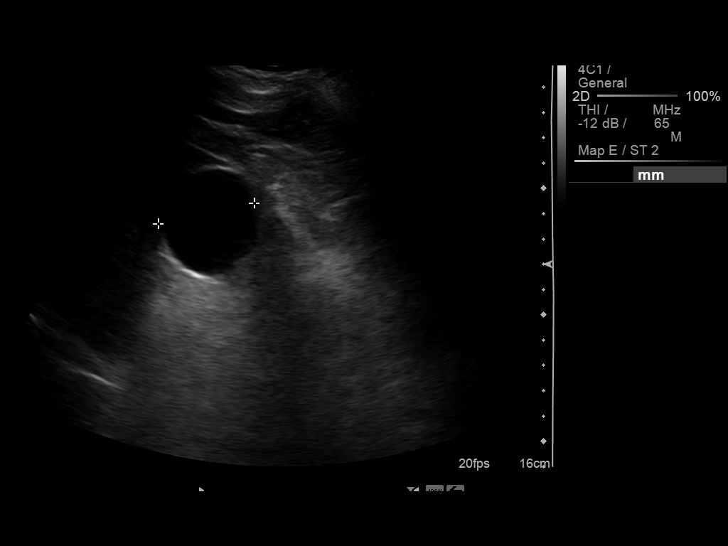
[im 40/40]
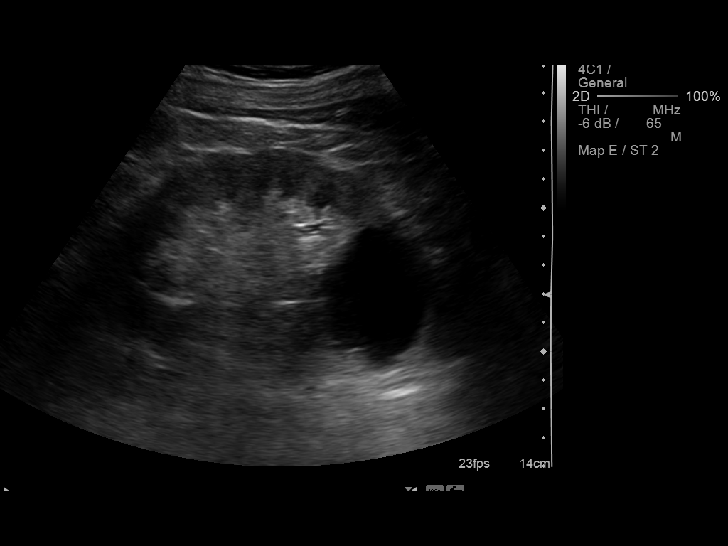

[14 of 25 positions shown; findings below may reference images not displayed]

FINDINGS: Right Kidney:  10.5 cm in length.  11 mm simple cyst in the
interpolar region.  No hydronephrosis or solid mass.  Normal
cortical echogenicity.

Left Kidney:  11.7 cm in length.  No hydronephrosis.  Normal
cortical echogenicity.  4.6 cm simple cyst in the upper pole.
cm simple cyst in the lower pole.  11 mm simple cyst in the
interpolar region.

Bladder:  Unremarkable bladder.  Prostate is 4.6 cm in diameter.
IMPRESSION: Simple cysts in the kidneys.  No evidence of hydronephrosis to
suggest obstruction.

## 2013-09-07 ENCOUNTER — Emergency Department (HOSPITAL_COMMUNITY)
Admission: EM | Admit: 2013-09-07 | Discharge: 2013-09-09 | Disposition: A | Discharge status: Other Acute Inpatient Hospital | Payer: Medicare Other | Attending: Emergency Medicine | Admitting: Emergency Medicine

## 2013-09-07 ENCOUNTER — Encounter (HOSPITAL_COMMUNITY): Payer: Self-pay | Admitting: Emergency Medicine

## 2013-09-07 DIAGNOSIS — R112 Nausea with vomiting, unspecified: Secondary | ICD-10-CM | POA: Insufficient documentation

## 2013-09-07 DIAGNOSIS — I10 Essential (primary) hypertension: Secondary | ICD-10-CM | POA: Insufficient documentation

## 2013-09-07 DIAGNOSIS — Z79899 Other long term (current) drug therapy: Secondary | ICD-10-CM | POA: Insufficient documentation

## 2013-09-07 DIAGNOSIS — T50902A Poisoning by unspecified drugs, medicaments and biological substances, intentional self-harm, initial encounter: Secondary | ICD-10-CM | POA: Insufficient documentation

## 2013-09-07 DIAGNOSIS — E079 Disorder of thyroid, unspecified: Secondary | ICD-10-CM | POA: Insufficient documentation

## 2013-09-07 DIAGNOSIS — F0390 Unspecified dementia without behavioral disturbance: Secondary | ICD-10-CM | POA: Insufficient documentation

## 2013-09-07 DIAGNOSIS — F3289 Other specified depressive episodes: Secondary | ICD-10-CM | POA: Insufficient documentation

## 2013-09-07 DIAGNOSIS — Z87891 Personal history of nicotine dependence: Secondary | ICD-10-CM | POA: Insufficient documentation

## 2013-09-07 DIAGNOSIS — T50901A Poisoning by unspecified drugs, medicaments and biological substances, accidental (unintentional), initial encounter: Secondary | ICD-10-CM | POA: Insufficient documentation

## 2013-09-07 DIAGNOSIS — F329 Major depressive disorder, single episode, unspecified: Secondary | ICD-10-CM

## 2013-09-07 DIAGNOSIS — F39 Unspecified mood [affective] disorder: Secondary | ICD-10-CM | POA: Insufficient documentation

## 2013-09-07 HISTORY — DX: Disorder of thyroid, unspecified: E07.9

## 2013-09-07 LAB — COMPREHENSIVE METABOLIC PANEL
AST: 34 U/L (ref 0–37)
Albumin: 4.4 g/dL (ref 3.5–5.2)
BUN: 13 mg/dL (ref 6–23)
Calcium: 10 mg/dL (ref 8.4–10.5)
Creatinine, Ser: 1.19 mg/dL (ref 0.50–1.35)
Total Bilirubin: 0.9 mg/dL (ref 0.3–1.2)
Total Protein: 7.4 g/dL (ref 6.0–8.3)

## 2013-09-07 LAB — CBC
HCT: 52.1 % — ABNORMAL HIGH (ref 39.0–52.0)
MCH: 34.9 pg — ABNORMAL HIGH (ref 26.0–34.0)
MCHC: 36.7 g/dL — ABNORMAL HIGH (ref 30.0–36.0)
MCV: 95.2 fL (ref 78.0–100.0)
Platelets: 216 10*3/uL (ref 150–400)
WBC: 15.5 10*3/uL — ABNORMAL HIGH (ref 4.0–10.5)

## 2013-09-07 LAB — RAPID URINE DRUG SCREEN, HOSP PERFORMED
Barbiturates: NOT DETECTED
Benzodiazepines: NOT DETECTED
Cocaine: NOT DETECTED
Opiates: NOT DETECTED

## 2013-09-07 LAB — ACETAMINOPHEN LEVEL: Acetaminophen (Tylenol), Serum: <15 ug/mL (ref 10–30)

## 2013-09-07 LAB — SALICYLATE LEVEL: Salicylate Lvl: <2 mg/dL — ABNORMAL LOW (ref 2.8–20.0)

## 2013-09-07 LAB — ETHANOL: Alcohol, Ethyl (B): <11 mg/dL (ref 0–11)

## 2013-09-07 MED ORDER — ACETAMINOPHEN 325 MG PO TABS: 650.0000 mg | ORAL_TABLET | ORAL | Status: DC | PRN

## 2013-09-07 MED ORDER — ALUM & MAG HYDROXIDE-SIMETH 200-200-20 MG/5ML PO SUSP: 30.0000 mL | ORAL | Status: DC | PRN

## 2013-09-07 MED ORDER — SODIUM CHLORIDE 0.9 % IV BOLUS (SEPSIS)
500.0000 mL | Freq: Once | INTRAVENOUS | Status: AC
  Administered 2013-09-07: 500 mL via INTRAVENOUS

## 2013-09-07 MED ORDER — SODIUM CHLORIDE 0.9 % IV SOLN: 1000.0000 mL | Freq: Once | INTRAVENOUS | Status: DC

## 2013-09-07 MED ORDER — NICOTINE 21 MG/24HR TD PT24: 21.0000 mg | MEDICATED_PATCH | Freq: Every day | TRANSDERMAL | Status: DC

## 2013-09-07 MED ORDER — ONDANSETRON HCL 4 MG PO TABS: 4.0000 mg | ORAL_TABLET | Freq: Three times a day (TID) | ORAL | Status: DC | PRN

## 2013-09-07 MED ORDER — ACETAMINOPHEN 325 MG PO TABS
650.0000 mg | ORAL_TABLET | Freq: Once | ORAL | Status: AC
  Administered 2013-09-07: 650 mg via ORAL
  Filled 2013-09-07: qty 2

## 2013-09-07 MED ORDER — LORAZEPAM 1 MG PO TABS: 1.0000 mg | ORAL_TABLET | Freq: Three times a day (TID) | ORAL | Status: DC | PRN

## 2013-09-07 MED ORDER — ZOLPIDEM TARTRATE 5 MG PO TABS: 5.0000 mg | ORAL_TABLET | Freq: Every evening | ORAL | Status: DC | PRN

## 2013-09-07 NOTE — ED Notes (Signed)
Pt in bathroom, daughter at bedside.

## 2013-09-07 NOTE — ED Notes (Signed)
Poison Control recommends following: Celexa can cause late onset seizures if ingested >600mg .  Treat seizures with Benzos.  Watch/monitor pt for 24 hr from time of ingestion, so 6pm tonight.  Repeat EKG around 5-6pm, monitor mental status and vital signs.   Pt states that he took between 10-12 tabs of Celexa.

## 2013-09-07 NOTE — ED Notes (Addendum)
Patient belongings are tan pants, belt, white undershirt, purple button up shirt, black shoes, white socks, watch and wallet. Watch and wallet are locked up in security. Patient clothes are locked up in locker 28. Patient and belongings have been wanded.

## 2013-09-07 NOTE — ED Provider Notes (Signed)
CSN: [id number]     Arrival date & time 09/07/13  1031 History   First MD Initiated Contact with Patient 09/07/13 1149     Chief Complaint  Patient presents with  . Medical Clearance  . Ingestion   (Consider location/radiation/quality/duration/timing/severity/associated sxs/prior Treatment) HPI Comments: Patient brought to the ER for evaluation after suicide attempt. Patient reports that he took all of his meds last night around 6 PM. Patient reports that he has had significant nausea and has vomited throughout the night. Patient reports ongoing depression. He reports that he has had significant worsening, reports that he has nothing to live for. Patient indicates that he is extremely lonely and that is why he did what he did.  Patient's daughter took me aside and give me more information. She reports that the patient has been talking about killing himself a lot lately, and make comments this past weekend, but she did not take them seriously because he says it so often. Patient also has a history of alcohol abuse. She reports that he has been drinking recently.  Patient is a 72 y.o. male presenting with Ingested Medication.  Ingestion Pertinent negatives include no abdominal pain.    Past Medical History  Diagnosis Date  . Dementia   . Hypertension   . Thyroid disease    Past Surgical History  Procedure Laterality Date  . Dental surgery    . Hand surgery    . Fracture surgery      knee  . Eye surgery     Family History  Problem Relation Age of Onset  . Breast cancer Sister    History  Substance Use Topics  . Smoking status: Former Smoker -- 1.00 packs/day for 30 years    Types: Cigarettes  . Smokeless tobacco: Not on file  . Alcohol Use: Yes     Comment: former heavy drinker, quit 3 years ago now occasionaly    Review of Systems  Gastrointestinal: Positive for nausea and vomiting. Negative for abdominal pain.  Psychiatric/Behavioral: Positive for suicidal ideas and  dysphoric mood.  All other systems reviewed and are negative.    Allergies  Review of patient's allergies indicates no known allergies.  Home Medications   Current Outpatient Rx  Name  Route  Sig  Dispense  Refill  . cholecalciferol (VITAMIN D) 1000 UNITS tablet   Oral   Take 1,000 Units by mouth daily.         . citalopram (CELEXA) 40 MG tablet   Oral   Take 0.5 tablets (20 mg total) by mouth daily.         Marland Kitchen donepezil (ARICEPT) 10 MG tablet   Oral   Take 10 mg by mouth at bedtime.         Marland Kitchen levothyroxine (SYNTHROID, LEVOTHROID) 150 MCG tablet   Oral   Take 150 mcg by mouth daily.         . Melatonin 1 MG TABS   Oral   Take 1 tablet by mouth at bedtime.         . Prenatal Vit-Fe Fumarate-FA (PRENATAL MULTIVITAMIN) TABS   Oral   Take 1 tablet by mouth daily.          BP 154/94  Pulse 89  Temp(Src) 97.7 F (36.5 C) (Oral)  Resp 18  SpO2 99% Physical Exam  Constitutional: He is oriented to person, place, and time. He appears well-developed and well-nourished. No distress.  HENT:  Head: Normocephalic and atraumatic.  Right Ear: Hearing  normal.  Left Ear: Hearing normal.  Nose: Nose normal.  Mouth/Throat: Oropharynx is clear and moist and mucous membranes are normal.  Eyes: Conjunctivae and EOM are normal. Pupils are equal, round, and reactive to light.  Neck: Normal range of motion. Neck supple.  Cardiovascular: Regular rhythm, S1 normal and S2 normal.  Exam reveals no gallop and no friction rub.   No murmur heard. Pulmonary/Chest: Effort normal and breath sounds normal. No respiratory distress. He exhibits no tenderness.  Abdominal: Soft. Normal appearance and bowel sounds are normal. There is no hepatosplenomegaly. There is no tenderness. There is no rebound, no guarding, no tenderness at McBurney's point and negative Murphy's sign. No hernia.  Musculoskeletal: Normal range of motion.  Neurological: He is alert and oriented to person, place, and  time. He has normal strength. No cranial nerve deficit or sensory deficit. Coordination normal. GCS eye subscore is 4. GCS verbal subscore is 5. GCS motor subscore is 6.  Skin: Skin is warm, dry and intact. No rash noted. No cyanosis.  Psychiatric: His speech is normal and behavior is normal. Thought content normal. He exhibits a depressed mood.    ED Course  Procedures (including critical care time)   Date: 09/07/2013  Rate: 96  Rhythm: normal sinus rhythm  QRS Axis: normal  Intervals: normal  ST/T Wave abnormalities: nonspecific ST/T changes  Conduction Disutrbances:none  Narrative Interpretation:   Old EKG Reviewed: more tachycardic    Labs Review Labs Reviewed  CBC - Abnormal; Notable for the following:    WBC 15.5 (*)    Hemoglobin 19.1 (*)    HCT 52.1 (*)    MCH 34.9 (*)    MCHC 36.7 (*)    All other components within normal limits  COMPREHENSIVE METABOLIC PANEL - Abnormal; Notable for the following:    Chloride 92 (*)    Glucose, Bld 156 (*)    GFR calc non Af Amer 59 (*)    GFR calc Af Amer 68 (*)    All other components within normal limits  SALICYLATE LEVEL - Abnormal; Notable for the following:    Salicylate Lvl <2.0 (*)    All other components within normal limits  GLUCOSE, CAPILLARY - Abnormal; Notable for the following:    Glucose-Capillary 145 (*)    All other components within normal limits  ETHANOL  ACETAMINOPHEN LEVEL  URINE RAPID DRUG SCREEN (HOSP PERFORMED)   Imaging Review No results found.  MDM  Diagnosis: 1. Depression 2. Overdose, suicide attempt  Patient presents to the ER for evaluation after an overdose. Patient admits to ongoing issues with depression. Patient has had nausea and vomiting since the ingestion, likely decreasing some of his absorption. The ingestion was yesterday at 6 PM.  The patient's chart indicates he is on vitamins with iron, but this is not too good he has no undertaking this. And has contacted about the other  ingestion today recommended medical clearance at 24 hours. That is hours from now. Patient will be continue to observe, repeat his EKG and medically cleared if no changes.  It was felt that the patient's tachycardia was secondary to anxiety and dehydration from nausea and vomiting. Patient given a fluid S. and his tachycardia has improved.    Gilda Crease, MD 09/07/13 [phone]

## 2013-09-07 NOTE — ED Notes (Signed)
Pt in bathroom

## 2013-09-07 NOTE — ED Provider Notes (Addendum)
Pt signed out to me by Dr. Blinda Leatherwood at change of shift to repeat EKG at 6pm and then patient would be medically cleared.  Repeat EKG reassuring, he does have a borderline prolonged QT interval which was also present on the prior EKG today.  Poison control called back to check on him and requested to check a magnesium level.  This was normal.  TTS consulted for psych eval as he is now medically cleared  17:51  Date: 09/07/2013  Rate: 87  Rhythm: sinus rhythm with PVCs  QRS Axis: normal  Intervals: borderline prolonged QT interval  ST/T Wave abnormalities: normal  Conduction Disutrbances:none  Narrative Interpretation:   Old EKG Reviewed: no significant change from prior   Ethelda Chick, MD 09/07/13 1956  Ethelda Chick, MD 09/07/13 0981  Ethelda Chick, MD 09/08/13 [phone]

## 2013-09-07 NOTE — ED Notes (Addendum)
Poison Control Angelique Blonder called to follow up. Requesting Magnesium lab drawn due to prolonged QTc on EKG.  Made EDP Linker aware. New orders given.

## 2013-09-07 NOTE — ED Notes (Signed)
Pt states that he ingested all his home meds at 6pm last night.  Pt states that he has lost two wives and started drinking alcohol after the second wife died and went to [facility] and currently living in the independent living side.  Pt states that there is no one there (resident wise) that he can really communicate with.  Pt states that the loneliness gets worse on the weekends when there isnt any activities going on at the facility.  Pt states that he vomited most of the night. And is currently not having SI thoughts at this time, but they come and go.

## 2013-09-07 NOTE — ED Notes (Signed)
Daughter states that patient has some incontinence issues at times. Placed patient in diaper.

## 2013-09-07 NOTE — ED Notes (Signed)
Tommy & Sindi, pt's son in law, phone number [phone]

## 2013-09-08 DIAGNOSIS — F332 Major depressive disorder, recurrent severe without psychotic features: Secondary | ICD-10-CM

## 2013-09-08 MED ORDER — DONEPEZIL HCL 10 MG PO TABS
10.0000 mg | ORAL_TABLET | Freq: Every day | ORAL | Status: DC
  Administered 2013-09-08: 10 mg via ORAL
  Filled 2013-09-08 (×3): qty 1

## 2013-09-08 MED ORDER — LEVOTHYROXINE SODIUM 150 MCG PO TABS
150.0000 ug | ORAL_TABLET | Freq: Every day | ORAL | Status: DC
  Administered 2013-09-08 – 2013-09-09 (×2): 150 ug via ORAL
  Filled 2013-09-08 (×4): qty 1

## 2013-09-08 MED ORDER — CITALOPRAM HYDROBROMIDE 20 MG PO TABS
20.0000 mg | ORAL_TABLET | Freq: Every day | ORAL | Status: DC
  Administered 2013-09-08 – 2013-09-09 (×2): 20 mg via ORAL
  Filled 2013-09-08 (×3): qty 1

## 2013-09-08 NOTE — Consult Note (Signed)
[facility] Face-to-Face Psychiatry Consult   Reason for Consult:  Mr Michael Weaver took an overdose Referring Physician:  ER MD  RONALD LONDO is an 72 y.o. male.  Assessment: AXIS I:  Major Depression, Recurrent severe AXIS II:  Deferred AXIS III:   Past Medical History  Diagnosis Date  . Dementia   . Hypertension   . Thyroid disease    AXIS IV:  other psychosocial or environmental problems and is lonely and depressed AXIS V:  41-50 serious symptoms  Plan:  Recommend psychiatric Inpatient admission when medically cleared.  Subjective:   Michael Weaver is a 72 y.o. male patient admitted with having taken an overdose of pills.  HPI:  Mr Michael Weaver has been depressed for awhile.  His second wife died a few years ago and he has lived in a retirement home.  He likes where he lives but on the weekends there is little to do and he gets bored and lonely.  His 2 daughters are busy with their own lives and he says he understands.  He has been having suicidal thoughts for awhile, but this was an overreaction he says.  Today he says he is not suicidal but is still depressed. HPI Elements:   Location:  ER. Quality:  suicidal attempt. Severity:  severe. Timing:  weekends are worse. Duration:  months. Context:  lonliness.  Past Psychiatric History: Past Medical History  Diagnosis Date  . Dementia   . Hypertension   . Thyroid disease     reports that he has quit smoking. His smoking use included Cigarettes. He has a 30 pack-year smoking history. He does not have any smokeless tobacco history on file. He reports that  drinks alcohol. He reports that he does not use illicit drugs. Family History  Problem Relation Age of Onset  . Breast cancer Sister            Allergies:  No Known Allergies  ACT Assessment Complete:  Yes:    Educational Status    Risk to Self: Risk to self Is patient at risk for suicide?: Yes Substance abuse history and/or treatment for substance abuse?: No  Risk to Others:     Abuse:    Prior Inpatient Therapy:    Prior Outpatient Therapy:    Additional Information:                    Objective: Blood pressure 153/83, pulse 72, temperature 97.5 F (36.4 C), temperature source Oral, resp. rate 16, SpO2 99.00%.There is no weight on file to calculate BMI. Results for orders placed during the hospital encounter of 09/07/13 (from the past 72 hour(s))  CBC     Status: Abnormal   Collection Time    09/07/13 12:14 PM      Result Value Range   WBC 15.5 (*) 4.0 - 10.5 K/uL   RBC 5.47  4.22 - 5.81 MIL/uL   Hemoglobin 19.1 (*) 13.0 - 17.0 g/dL   HCT 16.1 (*) 09.6 - 04.5 %   MCV 95.2  78.0 - 100.0 fL   MCH 34.9 (*) 26.0 - 34.0 pg   MCHC 36.7 (*) 30.0 - 36.0 g/dL   Comment: CORRECTED FOR INTERFERING SUBSTANCE   RDW NOT CALCULATED  11.5 - 15.5 %   Platelets 216  150 - 400 K/uL  COMPREHENSIVE METABOLIC PANEL     Status: Abnormal   Collection Time    09/07/13 12:14 PM      Result Value Range   Sodium  135  135 - 145 mEq/L   Potassium 3.8  3.5 - 5.1 mEq/L   Chloride 92 (*) 96 - 112 mEq/L   CO2 29  19 - 32 mEq/L   Glucose, Bld 156 (*) 70 - 99 mg/dL   BUN 13  6 - 23 mg/dL   Creatinine, Ser 1.61  0.50 - 1.35 mg/dL   Calcium 09.6  8.4 - 04.5 mg/dL   Total Protein 7.4  6.0 - 8.3 g/dL   Albumin 4.4  3.5 - 5.2 g/dL   AST 34  0 - 37 U/L   ALT 29  0 - 53 U/L   Alkaline Phosphatase 71  39 - 117 U/L   Total Bilirubin 0.9  0.3 - 1.2 mg/dL   GFR calc non Af Amer 59 (*) >90 mL/min   GFR calc Af Amer 68 (*) >90 mL/min   Comment: (NOTE)     The eGFR has been calculated using the CKD EPI equation.     This calculation has not been validated in all clinical situations.     eGFR's persistently <90 mL/min signify possible Chronic Kidney     Disease.  ETHANOL     Status: None   Collection Time    09/07/13 12:14 PM      Result Value Range   Alcohol, Ethyl (B) <11  0 - 11 mg/dL   Comment:            LOWEST DETECTABLE LIMIT FOR     SERUM ALCOHOL IS 11 mg/dL      FOR MEDICAL PURPOSES ONLY  ACETAMINOPHEN LEVEL     Status: None   Collection Time    09/07/13 12:14 PM      Result Value Range   Acetaminophen (Tylenol), Serum <15.0  10 - 30 ug/mL   Comment:            THERAPEUTIC CONCENTRATIONS VARY     SIGNIFICANTLY. A RANGE OF 10-30     ug/mL MAY BE AN EFFECTIVE     CONCENTRATION FOR MANY PATIENTS.     HOWEVER, SOME ARE BEST TREATED     AT CONCENTRATIONS OUTSIDE THIS     RANGE.     ACETAMINOPHEN CONCENTRATIONS     >150 ug/mL AT 4 HOURS AFTER     INGESTION AND >50 ug/mL AT 12     HOURS AFTER INGESTION ARE     OFTEN ASSOCIATED WITH TOXIC     REACTIONS.  SALICYLATE LEVEL     Status: Abnormal   Collection Time    09/07/13 12:14 PM      Result Value Range   Salicylate Lvl <2.0 (*) 2.8 - 20.0 mg/dL  URINE RAPID DRUG SCREEN (HOSP PERFORMED)     Status: None   Collection Time    09/07/13 12:17 PM      Result Value Range   Opiates NONE DETECTED  NONE DETECTED   Cocaine NONE DETECTED  NONE DETECTED   Benzodiazepines NONE DETECTED  NONE DETECTED   Amphetamines NONE DETECTED  NONE DETECTED   Tetrahydrocannabinol NONE DETECTED  NONE DETECTED   Barbiturates NONE DETECTED  NONE DETECTED   Comment:            DRUG SCREEN FOR MEDICAL PURPOSES     ONLY.  IF CONFIRMATION IS NEEDED     FOR ANY PURPOSE, NOTIFY LAB     WITHIN 5 DAYS.                LOWEST DETECTABLE LIMITS  FOR URINE DRUG SCREEN     Drug Class       Cutoff (ng/mL)     Amphetamine      1000     Barbiturate      200     Benzodiazepine   200     Tricyclics       300     Opiates          300     Cocaine          300     THC              50  GLUCOSE, CAPILLARY     Status: Abnormal   Collection Time    09/07/13 12:31 PM      Result Value Range   Glucose-Capillary 145 (*) 70 - 99 mg/dL  MAGNESIUM     Status: None   Collection Time    09/07/13  6:31 PM      Result Value Range   Magnesium 1.8  1.5 - 2.5 mg/dL   Labs are reviewed and are pertinent for no psychiatric  issue.  Current Facility-Administered Medications  Medication Dose Route Frequency Provider Last Rate Last Dose  . acetaminophen (TYLENOL) tablet 650 mg  650 mg Oral Q4H PRN Ethelda Chick, MD      . alum & mag hydroxide-simeth (MAALOX/MYLANTA) 200-200-20 MG/5ML suspension 30 mL  30 mL Oral PRN Ethelda Chick, MD      . citalopram (CELEXA) tablet 20 mg  20 mg Oral Daily Flint Melter, MD   20 mg at 09/08/13 1154  . donepezil (ARICEPT) tablet 10 mg  10 mg Oral QHS Flint Melter, MD      . levothyroxine (SYNTHROID, LEVOTHROID) tablet 150 mcg  150 mcg Oral Daily Flint Melter, MD   150 mcg at 09/08/13 1154  . LORazepam (ATIVAN) tablet 1 mg  1 mg Oral Q8H PRN Ethelda Chick, MD      . nicotine (NICODERM CQ - dosed in mg/24 hours) patch 21 mg  21 mg Transdermal Daily Ethelda Chick, MD      . ondansetron [facility]) tablet 4 mg  4 mg Oral Q8H PRN Ethelda Chick, MD      . zolpidem (AMBIEN) tablet 5 mg  5 mg Oral QHS PRN Ethelda Chick, MD       Current Outpatient Prescriptions  Medication Sig Dispense Refill  . cholecalciferol (VITAMIN D) 1000 UNITS tablet Take 1,000 Units by mouth daily.      . citalopram (CELEXA) 40 MG tablet Take 0.5 tablets (20 mg total) by mouth daily.      Marland Kitchen donepezil (ARICEPT) 10 MG tablet Take 10 mg by mouth at bedtime.      Marland Kitchen levothyroxine (SYNTHROID, LEVOTHROID) 150 MCG tablet Take 150 mcg by mouth daily.      . Melatonin 1 MG TABS Take 1 tablet by mouth at bedtime.      . Prenatal Vit-Fe Fumarate-FA (PRENATAL MULTIVITAMIN) TABS Take 1 tablet by mouth daily.        Psychiatric Specialty Exam:     Blood pressure 153/83, pulse 72, temperature 97.5 F (36.4 C), temperature source Oral, resp. rate 16, SpO2 99.00%.There is no weight on file to calculate BMI.  General Appearance: Casual  Eye Contact::  Good  Speech:  Clear and Coherent and Normal Rate  Volume:  Normal  Mood:  Depressed  Affect:  Appropriate  Thought Process:  Coherent,  Goal Directed and  Logical  Orientation:  Full (Time, Place, and Person)  Thought Content:  Negative  Suicidal Thoughts:  No  Homicidal Thoughts:  No  Memory:  Immediate;   Good Recent;   Good Remote;   Good  Judgement:  Good  Insight:  Fair  Psychomotor Activity:  Normal  Concentration:  Good  Recall:  Good  Akathisia:  Negative  Handed:  Right  AIMS (if indicated):     Assets:  Communication Skills Desire for Improvement Financial Resources/Insurance Housing Leisure Time Physical Health Social Support  Sleep:      Treatment Plan Summary: Daily contact with patient to assess and evaluate symptoms and progress in treatment Medication management admit to [facility] if possible and if not then to whatever bed can be found  Taquila Leys D 09/08/2013 1:00 PM

## 2013-09-08 NOTE — ED Notes (Signed)
Family called and stated pt has hx of bipolar and etoh abuse in the past

## 2013-09-08 NOTE — ED Notes (Signed)
Pt aaox3.  Pt sitting up in bed watching tv.  Pt deneis SI/HI/AH or VH at this time.  Pt reports "i am just waiting for supper."  Pt calm and cooperative at this time

## 2013-09-08 NOTE — Progress Notes (Addendum)
CSW contacted [facility].  No beds geri-psych beds at this time.  CSW contacted [facility].  Patients information has not been reviewed yet.  CSW spoke with [facility].  No beds available today but ok to send pt information review. CSW faxed referral to [facility].  Pt referred to [facility].   Marva Panda, Theresia Majors  [phone]  .09/08/2013  5:50 pm

## 2013-09-08 NOTE — ED Provider Notes (Signed)
10:28 PM Patient accepted to [facility].  He will be able to be transferred in the AM.  Gerhard Munch, MD 09/08/13 2228

## 2013-09-08 NOTE — BHH Counselor (Signed)
Writer consulted with Denice Bors, NP and Dr. Ladona Ridgel regarding the patient meeting criteria for inpatient hospitalization at [facility] to a 500 Hall Bed.   Writer contacted the [facility] Office and spoke to [facility]) regarding the patient being accepted and needing to be presented to [facility] on the adult unit.   Paige informed the Clinical research associate that there will not be an AC until 3pm today.

## 2013-09-08 NOTE — Progress Notes (Signed)
Pt referred to [facility] pending review.  Pt referred to [facility], at capacity.    Catha Gosselin, [place] [phone]  ED CSW .09/08/2013 1456pm

## 2013-09-08 NOTE — ED Notes (Signed)
Reports from psych RN shelia, pt unable to go back to psych ED due to hx of dementia.

## 2013-09-08 NOTE — Progress Notes (Addendum)
CSW received call back from [facility].  He reports that pt appears to be a good candidate and asked if pt could be IVC'ed for consideration.   CSW reported to him that I would have to consult with EDP.   CSW consulted with EDP Jeraldine Loots, MD.  Dr. Jeraldine Loots states that he would IVC pt if he is accepted and it is needed for transport, but not just for patient to be considered for placement.   CSW called Thayer Ohm at [facility] to report EDPs concerns and that he would only IVC pt if pt is being accepted.  Per Thayer Ohm Pt accepted to [facility] by Dr. Loreta Ave, but he needs to [facility] for transport.  Report can be called into [phone].   Marland KitchenKarle Plumber  [phone]   .09/08/2013 9:28 pm   CSW consulted with Dr. Jeraldine Loots to complete IVC.  ED Secretary to notarize and fax information to the night magistrate.

## 2013-09-09 NOTE — ED Notes (Signed)
sherriff dept to bring ivc papers.

## 2013-09-09 NOTE — ED Notes (Signed)
Transportation with sheriff has been called and set up. Estimated to arrive in one to two hours. Will continue to monitor.

## 2013-09-09 NOTE — ED Notes (Signed)
Report called to [facility]. Waiting for transportation. Security is gathering all his personal belongings from the safe.

## 2013-09-09 NOTE — Progress Notes (Signed)
Patient to be admitted to [facility], rn and edp aware. Pt signed consent to release information to pt daughter and son and law. CSW spoke with pt son in law [place], and in agreement of plan and plan to visit pt this afternoon. Patient in agreement with disposition. Pt to be transproted under IVC to [facility]. RN called report.   Catha Gosselin, LCSW [phone]  ED CSW .09/09/2013 930am

## 2013-09-10 ENCOUNTER — Telehealth: Payer: Self-pay | Admitting: Neurology

## 2013-09-10 NOTE — Telephone Encounter (Signed)
  i have no advise - This is psychiatry , not neurology.

## 2013-09-28 NOTE — Telephone Encounter (Signed)
Please see note below, psychiatry recommended that patient see neurologist to check his memory and cognitive function.

## 2013-09-28 NOTE — Telephone Encounter (Signed)
Spoke to daughter and she relayed that the patient is now in [employer] and has seen a psychiatrist.  The psychiatrist has recommended that the patient follow up with neurology to check his memory and cognitive function.  I have made appointment to follow up with NP in November.

## 2013-09-29 NOTE — Telephone Encounter (Signed)
Patient relapsed into alcohol abuse,  moved to assisted living,but  memory is changed , " Borderline personality disorder" - there is always that threat of suicide. He threatened the staff at [facility], and called his daughter and stated"  get me out or I kill myself".  He was belligerent. He went home and took an overdose of his pills, he threw up and called his daughter in the morning. He has a Therapist, sports in [place] . Needs a local one. I suggested Dr Donell Beers, Dr Nolen Mu and Dr Dub Mikes.  I will see him here at [facility] with MMSE and MOCA- if things have slipped dramatically over the last year , may refer to Dr Leonides Cave for more extensive memory battery.  His  Daughter agreed Arline Asp  [phone])

## 2013-09-29 NOTE — Telephone Encounter (Signed)
I will be happy to have a MMSE and MOCA done - may I suggest a  referalr to Dr. Leonides Cave for detailed testing , perhaps even before follow up appointment with me?

## 2013-10-02 ENCOUNTER — Ambulatory Visit: Payer: Self-pay | Admitting: Nurse Practitioner

## 2013-10-12 NOTE — Telephone Encounter (Signed)
Left message for daughter Jamison Oka and scheduled appointment for 11-03-13 at 3:30.  Asked her to call if patient unable to make the appt.

## 2013-10-12 NOTE — Telephone Encounter (Signed)
Thank you - did we get the report from psychiatry?

## 2013-10-13 ENCOUNTER — Telehealth: Payer: Self-pay | Admitting: Neurology

## 2013-10-13 NOTE — Telephone Encounter (Signed)
Request made with Lupita Leash in [place] to get Psychiatry records.

## 2013-10-13 NOTE — Telephone Encounter (Signed)
Daughter of patient left message that she will not be able to take patient on 11-03-13.  I have left her a message with 2 other available dates and for her to get back to me.

## 2013-10-15 ENCOUNTER — Ambulatory Visit: Payer: Self-pay | Admitting: Nurse Practitioner

## 2013-11-03 ENCOUNTER — Ambulatory Visit: Payer: Self-pay | Admitting: Neurology

## 2013-11-11 ENCOUNTER — Encounter: Payer: Self-pay | Admitting: Neurology

## 2013-11-11 ENCOUNTER — Ambulatory Visit (INDEPENDENT_AMBULATORY_CARE_PROVIDER_SITE_OTHER): Payer: MEDICARE | Admitting: Neurology

## 2013-11-11 VITALS — BP 103/67 | HR 66 | Resp 16 | Ht 72.0 in | Wt 164.0 lb

## 2013-11-11 DIAGNOSIS — F0391 Unspecified dementia with behavioral disturbance: Secondary | ICD-10-CM

## 2013-11-11 DIAGNOSIS — F1010 Alcohol abuse, uncomplicated: Secondary | ICD-10-CM

## 2013-11-11 DIAGNOSIS — F03918 Unspecified dementia, unspecified severity, with other behavioral disturbance: Secondary | ICD-10-CM

## 2013-11-11 HISTORY — DX: Alcohol abuse, uncomplicated: F10.10

## 2013-11-11 MED ORDER — DONEPEZIL HCL 10 MG PO TABS: 10.0000 mg | ORAL_TABLET | Freq: Every day | ORAL | Status: AC

## 2013-11-11 MED ORDER — ALPRAZOLAM 0.25 MG PO TABS: 0.2500 mg | ORAL_TABLET | Freq: Once | ORAL | Status: AC

## 2013-11-11 NOTE — Patient Instructions (Signed)
Dementia Dementia is a general term for problems with brain function. A person with dementia has memory loss and a hard time with at least one other brain function such as thinking, speaking, or problem solving. Dementia can affect social functioning, how you do your job, your mood, or your personality. The changes may be hidden for a long time. The earliest forms of this disease are usually not detected by family or friends. Dementia can be:  Irreversible.  Potentially reversible.  Partially reversible.  Progressive. This means it can get worse over time. CAUSES  Irreversible dementia causes may include:  Degeneration of brain cells (Alzheimer's disease or lewy body dementia).  Multiple small strokes (vascular dementia).  Infection (chronic meningitis or Creutzfelt-Jakob disease).  Frontotemporal dementia. This affects younger people, age 40 to 70, compared to those who have Alzheimer's disease.  Dementia associated with other disorders like Parkinson's disease, Huntington's disease, or HIV-associated dementia. Potentially or partially reversible dementia causes may include:  Medicines.  Metabolic causes such as excessive alcohol intake, vitamin B12 deficiency, or thyroid disease.  Masses or pressure in the brain such as a tumor, blood clot, or hydrocephalus. SYMPTOMS  Symptoms are often hard to detect. Family members or coworkers may not notice them early in the disease process. Different people with dementia may have different symptoms. Symptoms can include:  A hard time with memory, especially recent memory. Long-term memory may not be impaired.  Asking the same question multiple times or forgetting something someone just said.  A hard time speaking your thoughts or finding certain words.  A hard time solving problems or performing familiar tasks (such as how to use a telephone).  Sudden changes in mood.  Changes in personality, especially increasing moodiness or  mistrust.  Depression.  A hard time understanding complex ideas that were never a problem in the past. DIAGNOSIS  There are no specific tests for dementia.   Your caregiver may recommend a thorough evaluation. This is because some forms of dementia can be reversible. The evaluation will likely include a physical exam and getting a detailed history from you and a family member. The history often gives the best clues and suggestions for a diagnosis.  Memory testing may be done. A detailed brain function evaluation called neuropsychologic testing may be helpful.  Lab tests and brain imaging (such as a CT scan or MRI scan) are sometimes important.  Sometimes observation and re-evaluation over time is very helpful. TREATMENT  Treatment depends on the cause.   If the problem is a vitamin deficiency, it may be helped or cured with supplements.  For dementias such as Alzheimer's disease, medicines are available to stabilize or slow the course of the disease. There are no cures for this type of dementia.  Your caregiver can help direct you to groups, organizations, and other caregivers to help with decisions in the care of you or your loved one. HOME CARE INSTRUCTIONS The care of individuals with dementia is varied and dependent upon the progression of the dementia. The following suggestions are intended for the person living with, or caring for, the person with dementia.  Create a safe environment.  Remove the locks on bathroom doors to prevent the person from accidentally locking himself or herself in.  Use childproof latches on kitchen cabinets and any place where cleaning supplies, chemicals, or alcohol are kept.  Use childproof covers in unused electrical outlets.  Install childproof devices to keep doors and windows secured.  Remove stove knobs or install safety   knobs and an automatic shut-off on the stove.  Lower the temperature on water heaters.  Label medicines and keep them  locked up.  Secure knives, lighters, matches, power tools, and guns, and keep these items out of reach.  Keep the house free from clutter. Remove rugs or anything that might contribute to a fall.  Remove objects that might break and hurt the person.  Make sure lighting is good, both inside and outside.  Install grab rails as needed.  Use a monitoring device to alert you to falls or other needs for help.  Reduce confusion.  Keep familiar objects and people around.  Use night lights or dim lights at night.  Label items or areas.  Use reminders, notes, or directions for daily activities or tasks.  Keep a simple, consistent routine for waking, meals, bathing, dressing, and bedtime.  Create a calm, quiet environment.  Place large clocks and calendars prominently.  Display emergency numbers and home address near all telephones.  Use cues to establish different times of the day. An example is to open curtains to let the natural light in during the day.   Use effective communication.  Choose simple words and short sentences.  Use a gentle, calm tone of voice.  Be careful not to interrupt.  If the person is struggling to find a word or communicate a thought, try to provide the word or thought.  Ask one question at a time. Allow the person ample time to answer questions. Repeat the question again if the person does not respond.  Reduce nighttime restlessness.  Provide a comfortable bed.  Have a consistent nighttime routine.  Ensure a regular walking or physical activity schedule. Involve the person in daily activities as much as possible.  Limit napping during the day.  Limit caffeine.  Attend social events that stimulate rather than overwhelm the senses.  Encourage good nutrition and hydration.  Reduce distractions during meal times and snacks.  Avoid foods that are too hot or too cold.  Monitor chewing and swallowing ability.  Continue with routine vision,  hearing, dental, and medical screenings.  Only give over-the-counter or prescription medicines as directed by the caregiver.  Monitor driving abilities. Do not allow the person to drive when safe driving is no longer possible.  Register with an identification program which could provide location assistance in the event of a missing person situation. SEEK MEDICAL CARE IF:   New behavioral problems start such as moodiness, aggressiveness, or seeing things that are not there (hallucinations).  Any new problem with brain function happens. This includes problems with balance, speech, or falling a lot.  Problems with swallowing develop.  Any symptoms of other illness happen. Small changes or worsening in any aspect of brain function can be a sign that the illness is getting worse. It can also be a sign of another medical illness such as infection. Seeing a caregiver right away is important. SEEK IMMEDIATE MEDICAL CARE IF:   A fever develops.  New or worsened confusion develops.  New or worsened sleepiness develops.  Staying awake becomes hard to do. Document Released: 05/22/2001 Document Revised: 02/18/2012 Document Reviewed: 04/23/2011 ExitCare Patient Information 2014 [employer].  

## 2013-11-11 NOTE — Progress Notes (Signed)
[facility]  Provider:  Melvyn Novas, M D  Referring Provider: [name], PA-C Primary Care Physician:  [name]  Chief Complaint  Patient presents with  . Memory Loss    Pt was given 2 types of memory test today MOCA/MMSE    HPI:  Michael Weaver is a 72 y.o. male  Is seen here as a referral/ revisit  from [name] for a follow up on a memory disorder. This does and that had been seen throughout the year 2013 for a memory problem and had undergone repetitive cognitive testing. He is followed by Dr. Erling Conte it elected triad at the time and he was referred, he is twice widowed both times his pulses at prolonged illnesses. He resided at the time of our last visit in November 2013 at [facility] cream be performed a [place]ral cognitive assessment test at that time he scored only 21 a 30 points but did very well on the Mini-Mental test is 29/30 to points. He has a family history of Alzheimer's which affected his father, but at an advanced age his mother died of a blood clot at age 85 and there was no reported cognitive impairment his father had depression and he died at age 61.  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  Interval history the patient was admitted to [facility] on 9-29 2014 in the morning hours after a non-accidental overdose of medication, which  the patient ingested  about 6 PM the night before . He than developed significant nausea and vomited throughout the night as a partially detoxicated himself.  He reports that at the time that he had ongoing depression and felt that it was not worth to live for anything-  he also felt extremely lonely .  He had not made serious plans to implement  that he had spoken about killing himself frequently. At the time of the  suicide attempt,  he may have also been exposed to alcohol again.  He has been twice referred for neuro-psychologcal testing , but has not been seen . I had asked for Dr. Leonides Cave to schedule an appointment        Review of Systems: Out of a complete 14 system review, the patient complains of only the following symptoms, and all other reviewed systems are negative.  antisocial personality- feels un loved, un respected ,alone and undeservingly ignored.  His daughter is main contact, he lives at [facility].   History   Social History  . Marital Status: Married    Spouse Name: N/A    Number of Children: N/A  . Years of Education: N/A   Occupational History  . Not on file.   Social History Main Topics  . Smoking status: Former Smoker -- 1.00 packs/day for 30 years    Types: Cigarettes  . Smokeless tobacco: Not on file  . Alcohol Use: Yes     Comment: former heavy drinker, quit 3 years ago now occasionaly  . Drug Use: No  . Sexual Activity: Not on file   Other Topics Concern  . Not on file   Social History Narrative  . No narrative on file    Family History  Problem Relation Age of Onset  . Breast cancer Sister     Past Medical History  Diagnosis Date  . Dementia   . Hypertension   . Thyroid disease     Past Surgical History  Procedure Laterality Date  . Dental surgery    . Hand surgery    . Fracture surgery      knee  . Eye surgery      Current Outpatient Prescriptions  Medication Sig Dispense Refill  . cholecalciferol (VITAMIN D) 1000 UNITS tablet Take 1,000 Units by mouth daily.      Marland Kitchen donepezil (ARICEPT) 10 MG tablet Take 10 mg by mouth at bedtime.      Marland Kitchen FLUoxetine (PROZAC) 20 MG capsule       . levothyroxine (SYNTHROID, LEVOTHROID) 150 MCG tablet Take 125 mcg by mouth daily.       Marland Kitchen lisinopril (PRINIVIL,ZESTRIL) 10 MG tablet       . Melatonin 1 MG TABS Take 1 tablet by  mouth at bedtime.      . Prenatal Vit-Fe Fumarate-FA (PRENATAL MULTIVITAMIN) TABS Take 1 tablet by mouth daily.      . citalopram (CELEXA) 40 MG tablet Take 0.5 tablets (20 mg total) by mouth daily.       No current facility-administered medications for this visit.    Allergies as of 11/11/2013  . (No Known Allergies)    Vitals: BP 103/67  Pulse 66  Resp 16  Ht 6' (1.829 m)  Wt 164 lb (74.39 kg)  BMI 22.24 kg/m2 Last Weight:  Wt Readings from Last 1 Encounters:  11/11/13 164 lb (74.39 kg)   Last Height:   Ht Readings from Last 1 Encounters:  11/11/13 6' (1.829 m)    Physical exam:  General: The patient is awake, alert and appears not in acute distress. The patient is well groomed. Head: Normocephalic, atraumatic. Neck is supple. Mallampati 2 , neck circumference:14 Cardiovascular:  Regular rate and rhythm , without  murmurs or carotid bruit, and without distended neck veins. Respiratory: Lungs are clear to auscultation. Skin:  Without evidence of edema, or rash Trunk: BMI is normal, normal  posture.  Neurologic exam : The patient is awake and alert, oriented to place and time.   Memory subjective described as intact, except to short term memory . MOCA 25 -30 - he missed 4 of 5 recall words.  . There is a normal attention span & concentration ability.  Speech is fluent without   dysarthria, dysphonia or aphasia. Mood and affect are appropriate.  Cranial nerves: Pupils are equal and briskly reactive to light.  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    To Funduscopic exam without  evidence of pallor or edema.  Extraocular movements  in vertical and horizontal planes intact and without nystagmus. Visual fields by finger perimetry are intact. Hearing to finger rub intact.   Facial sensation intact to fine touch. Facial motor strength is symmetric and tongue and uvula move midline.  Motor exam:   Normal tone and normal muscle bulk and symmetric  normal strength in all extremities. He has mild cog-wheeling. He has a flushed face.   Sensory:  Fine touch, pinprick and vibration were tested in all extremities. Proprioception is tested in the upper extremities only. This was normal.  Coordination: Rapid alternating movements in the fingers/hands is tested and normal. Finger-to-nose maneuver without evidence of ataxia, dysmetria - mild resting  tremor.  Gait and station: Patient walks without assistive device, unassisted able to climb up to the exam table.  Strength within  normal limits. Stance is stable and normal. Tandem gait  Impossible  He g has gait ataxia and falls forward and to the right.  Turns are fragmented. Romberg testing reveals a propulsive trend  Deep tendon reflexes: in the  upper and lower extremities are symmetric and intact. Babinski maneuver response isdowngoing.   Assessment:  After physical and neurologic examination, review of laboratory studies, imaging, neurophysiology testing and pre-existing records, assessment is  1)  ETOH induced dementia, ataxia , perseveration- but improved since he stopped drinking again.  2) cognitive impairment , no hallucinations, no eye movement abnormailties .   Wernicke -Korsakoff / Lewy body differential dicussed .   Plan:  Treatment plan and additional workup :  Alcohol cessation.  I still like for him to undergo a neuropsychological testing battery

## 2017-12-16 ENCOUNTER — Emergency Department (HOSPITAL_COMMUNITY)
Admission: EM | Admit: 2017-12-16 | Discharge: 2017-12-17 | Disposition: A | Discharge status: Home / Self Care | Payer: Medicare Other | Attending: Emergency Medicine | Admitting: Emergency Medicine

## 2017-12-16 ENCOUNTER — Encounter (HOSPITAL_COMMUNITY): Payer: Self-pay | Admitting: Emergency Medicine

## 2017-12-16 ENCOUNTER — Other Ambulatory Visit: Payer: Self-pay

## 2017-12-16 DIAGNOSIS — Z79899 Other long term (current) drug therapy: Secondary | ICD-10-CM | POA: Diagnosis not present

## 2017-12-16 DIAGNOSIS — F4329 Adjustment disorder with other symptoms: Secondary | ICD-10-CM | POA: Diagnosis present

## 2017-12-16 DIAGNOSIS — R45851 Suicidal ideations: Secondary | ICD-10-CM | POA: Insufficient documentation

## 2017-12-16 DIAGNOSIS — Z87891 Personal history of nicotine dependence: Secondary | ICD-10-CM | POA: Insufficient documentation

## 2017-12-16 DIAGNOSIS — F0390 Unspecified dementia without behavioral disturbance: Secondary | ICD-10-CM | POA: Insufficient documentation

## 2017-12-16 DIAGNOSIS — F1010 Alcohol abuse, uncomplicated: Secondary | ICD-10-CM | POA: Diagnosis not present

## 2017-12-16 DIAGNOSIS — F4325 Adjustment disorder with mixed disturbance of emotions and conduct: Secondary | ICD-10-CM | POA: Diagnosis not present

## 2017-12-16 DIAGNOSIS — F329 Major depressive disorder, single episode, unspecified: Secondary | ICD-10-CM | POA: Diagnosis present

## 2017-12-16 LAB — CBC
HCT: 46.9 % (ref 39.0–52.0)
HEMOGLOBIN: 16.6 g/dL (ref 13.0–17.0)
MCH: 36.6 pg — AB (ref 26.0–34.0)
MCHC: 35.4 g/dL (ref 30.0–36.0)
MCV: 103.3 fL — ABNORMAL HIGH (ref 78.0–100.0)
Platelets: 200 10*3/uL (ref 150–400)
RBC: 4.54 MIL/uL (ref 4.22–5.81)
RDW: 12.2 % (ref 11.5–15.5)
WBC: 6.7 10*3/uL (ref 4.0–10.5)

## 2017-12-16 LAB — COMPREHENSIVE METABOLIC PANEL
ALT: 30 U/L (ref 17–63)
ANION GAP: 8 (ref 5–15)
AST: 44 U/L — ABNORMAL HIGH (ref 15–41)
Albumin: 4.3 g/dL (ref 3.5–5.0)
Alkaline Phosphatase: 67 U/L (ref 38–126)
BUN: 9 mg/dL (ref 6–20)
CO2: 26 mmol/L (ref 22–32)
Calcium: 9.3 mg/dL (ref 8.9–10.3)
Chloride: 103 mmol/L (ref 101–111)
Creatinine, Ser: 1.01 mg/dL (ref 0.61–1.24)
GFR calc Af Amer: >60 mL/min (ref >60)
GFR calc non Af Amer: >60 mL/min (ref >60)
Glucose, Bld: 103 mg/dL — ABNORMAL HIGH (ref 65–99)
POTASSIUM: 4.2 mmol/L (ref 3.5–5.1)
SODIUM: 137 mmol/L (ref 135–145)
TOTAL PROTEIN: 7.2 g/dL (ref 6.5–8.1)
Total Bilirubin: 0.7 mg/dL (ref 0.3–1.2)

## 2017-12-16 NOTE — ED Notes (Signed)
Bed: WTR7 Expected date:  Expected time:  Means of arrival:  Comments: 

## 2017-12-16 NOTE — ED Triage Notes (Signed)
Pt presents with family for suicidal ideation with plan of cutting throat along with alcohol abuse. Pt currently calm and cooperative at this time.

## 2017-12-17 DIAGNOSIS — F4329 Adjustment disorder with other symptoms: Secondary | ICD-10-CM | POA: Diagnosis present

## 2017-12-17 LAB — RAPID URINE DRUG SCREEN, HOSP PERFORMED
Amphetamines: NOT DETECTED
BARBITURATES: NOT DETECTED
Benzodiazepines: NOT DETECTED
Cocaine: NOT DETECTED
Opiates: NOT DETECTED
Tetrahydrocannabinol: NOT DETECTED

## 2017-12-17 LAB — ETHANOL: ALCOHOL ETHYL (B): 25 mg/dL — AB (ref <10)

## 2017-12-17 LAB — ACETAMINOPHEN LEVEL

## 2017-12-17 LAB — SALICYLATE LEVEL: Salicylate Lvl: <7 mg/dL (ref 2.8–30.0)

## 2017-12-17 MED ORDER — LORAZEPAM 2 MG/ML IJ SOLN: 0.0000 mg | Freq: Two times a day (BID) | INTRAMUSCULAR | Status: DC

## 2017-12-17 MED ORDER — LEVOTHYROXINE SODIUM 125 MCG PO TABS
125.0000 ug | ORAL_TABLET | Freq: Every day | ORAL | Status: DC
  Administered 2017-12-17: 125 ug via ORAL
  Filled 2017-12-17: qty 1

## 2017-12-17 MED ORDER — FLUOXETINE HCL 20 MG PO CAPS
20.0000 mg | ORAL_CAPSULE | Freq: Every day | ORAL | Status: DC
  Administered 2017-12-17: 20 mg via ORAL
  Filled 2017-12-17: qty 1

## 2017-12-17 MED ORDER — THIAMINE HCL 100 MG/ML IJ SOLN: 100.0000 mg | Freq: Every day | INTRAMUSCULAR | Status: DC

## 2017-12-17 MED ORDER — LORAZEPAM 1 MG PO TABS: 0.0000 mg | ORAL_TABLET | Freq: Two times a day (BID) | ORAL | Status: DC

## 2017-12-17 MED ORDER — DONEPEZIL HCL 5 MG PO TABS: 10.0000 mg | ORAL_TABLET | Freq: Every day | ORAL | Status: DC

## 2017-12-17 MED ORDER — ACETAMINOPHEN 325 MG PO TABS: 650.0000 mg | ORAL_TABLET | ORAL | Status: DC | PRN

## 2017-12-17 MED ORDER — LORAZEPAM 1 MG PO TABS: 0.0000 mg | ORAL_TABLET | Freq: Four times a day (QID) | ORAL | Status: DC

## 2017-12-17 MED ORDER — VITAMIN B-1 100 MG PO TABS
100.0000 mg | ORAL_TABLET | Freq: Every day | ORAL | Status: DC
  Administered 2017-12-17: 100 mg via ORAL
  Filled 2017-12-17: qty 1

## 2017-12-17 MED ORDER — LISINOPRIL 10 MG PO TABS
10.0000 mg | ORAL_TABLET | Freq: Every day | ORAL | Status: DC
  Administered 2017-12-17: 10 mg via ORAL
  Filled 2017-12-17: qty 1

## 2017-12-17 MED ORDER — VITAMIN D3 25 MCG (1000 UNIT) PO TABS
1000.0000 [IU] | ORAL_TABLET | Freq: Every day | ORAL | Status: DC
  Administered 2017-12-17: 1000 [IU] via ORAL
  Filled 2017-12-17: qty 1

## 2017-12-17 MED ORDER — MELATONIN 1 MG PO TABS: 1.0000 | ORAL_TABLET | Freq: Every day | ORAL | Status: DC

## 2017-12-17 MED ORDER — LORAZEPAM 2 MG/ML IJ SOLN: 0.0000 mg | Freq: Four times a day (QID) | INTRAMUSCULAR | Status: DC

## 2017-12-17 NOTE — BH Assessment (Addendum)
Assessment Note  Michael Weaver is an 77 y.o. male who presents voluntarily to [facility] with is daughter and son in law and participated in the assessment at the Pt's request.  Pt and his family report symptoms of depression and SI.  Pt reports current SI with plans of blocking his airway and stopping himself from breathing.  Pt acknowledges symptoms of depression including isolation, insomnia, weight loss, irritability, restlessness, anger and hopelessness.  Pt denies HI and AVH.  Pt states his current stressors include the recent changes in his living situation and not currently living in his own place.  Pt currently lives with is daughter and son in law until he can move into another English as a second language teacher.  Pt states that his daughter and son in law are supports to him.  Pt denies history of abuse and trauma.  Pt reports there is a family history of Schizophrenia, his father.  Pt has fair insight and judgement.  Pt denies any current legal issues.    Pt's OP history includes therapy in 2014 after a previous suicide attempt.  Pt reports using alcohol daily when he can, up to 6 cans of beer per day.  Pt is dressed in scrubs, alert oriented x4 with normal speech and normal motor behavior.  Pt's mood is depressed and affect is congruent with mood.  Thought process is coherent and relevant.  There is no indication the Pt is currently responding to internal stimuli or experiencing delusional thought content.  Pt was cooperative throughout the assessment. Pt states he is willing to sign voluntarily into a psychiatric facility.  Pt's daughter is very fearful Pt will act on suicidal thoughts because he has done so in the past and he has expressed suicidal ideation 4 times over the past 2 days.     Diagnosis: F43.25 Adjustment disorder, With mixed disturbance of emotions and conduct, F10.20 Alcohol use disorder, Moderate   Past Medical History:  Past Medical History:  Diagnosis Date  . Alcohol abuse, unspecified  11/11/2013  . Dementia   . Hypertension   . Thyroid disease     Past Surgical History:  Procedure Laterality Date  . DENTAL SURGERY    . EYE SURGERY    . FRACTURE SURGERY     knee  . HAND SURGERY      Family History:  Family History  Problem Relation Age of Onset  . Breast cancer Sister     Social History:  reports that he has quit smoking. His smoking use included cigarettes. He has a 30.00 pack-year smoking history. he has never used smokeless tobacco. He reports that he drinks alcohol. He reports that he does not use drugs.  Additional Social History:     CIWA: CIWA-Ar BP: 140/89 Pulse Rate: 78 Nausea and Vomiting: no nausea and no vomiting Tactile Disturbances: none Tremor: not visible, but can be felt fingertip to fingertip Auditory Disturbances: not present Paroxysmal Sweats: no sweat visible Visual Disturbances: not present Anxiety: no anxiety, at ease Headache, Fullness in Head: none present Agitation: normal activity Orientation and Clouding of Sensorium: oriented and can do serial additions CIWA-Ar Total: 1 COWS:    Allergies: No Known Allergies  Home Medications:  (Not in a hospital admission)  OB/GYN Status:  No LMP for male patient.  General Assessment Data Location of Assessment: [facility] ED TTS Assessment: In system Is this a Tele or Face-to-Face Assessment?: Face-to-Face Is this an Initial Assessment or a Re-assessment for this encounter?: Initial Assessment Marital status: Widowed  Risk to self with the past 6 months Is patient at risk for suicide?: Yes Substance abuse history and/or treatment for substance abuse?: Yes                                     Advance Directives (For Healthcare) Does Patient Have a Medical Advance Directive?: Yes Does patient want to make changes to medical advance directive?: No - Patient declined Type of Advance Directive: Healthcare Power of Attorney Copy of Healthcare Power of  Attorney in Chart?: No - copy requested Would patient like information on creating a medical advance directive?: No - Patient declined          Disposition: Gave clinical report to [name], PA who said patient meets criteria for inpatient psychiatric treatment.  SW to look for placement in the morning. Notified Dr. Patria Maneampos of recommendation.    On Site Evaluation by:   Reviewed with Physician:    Annamaria BootsValarie Diavian Furgason, MS, [facility] Therapeutic Triage Specialist  Annamaria BootsValarie  [name] 12/17/2017 12:48 AM

## 2017-12-17 NOTE — Progress Notes (Signed)
CSW received phone call from pt's PCP, [facility], [phone]. NP explained that she received a phone call crying from the daughter. Daughter informed NP that she will not take the pt back home. She does not feel comfortable having pt in the home.   CSW explained to PCP that patient has been psychiatrically cleared and has been discharged. CSW explained that pt is able to make his own decisions and is denying SI/HI. Pt is denying out patient services. NP informed CSW that [facility] had [facility], but does not know when or how long ago. According to the PCP, the daughter stated that pt had been kicked out of the Independent Living.  CSW explained to PCP that if daughter continues to refuse to pick him up, pt will be discharged to a shelter. PCP stated she would call daughter back and see if she is able to come pick the pt up.  Montine CircleKelsy Korie Brabson, Silverio LayLCSWA [facility]  [phone]

## 2017-12-17 NOTE — ED Notes (Signed)
Daughter called reports that she will not come pick up patient. Social worker called.

## 2017-12-17 NOTE — Progress Notes (Signed)
CSW spoke with pt's son in law. Pt[facility] in [facility] Pt's son in law informed CSW that pt's daughter (his wife) was back with the pt to bring the pt home.   Pt will be seen by PCP through a house call tomorrow.  Montine CircleKelsy Datha Kissinger, Silverio LayLCSWA [facility]  [phone]

## 2017-12-17 NOTE — ED Notes (Signed)
Patient has been assessed by peer support and patient does not want the services. He reports he will not drinking. Spoke with Dr. Rubin PayorPickering, he states he will come back to see patient for possible discharge.

## 2017-12-17 NOTE — BHH Suicide Risk Assessment (Signed)
Suicide Risk Assessment  Discharge Assessment   [facility]   Principal Problem: Adjustment disorder with disturbance of emotion Discharge Diagnoses:  Patient Active Problem List   Diagnosis Date Noted  . Adjustment disorder with disturbance of emotion [F43.29] 12/17/2017  . Alcohol abuse, unspecified [F10.10] 11/11/2013  . Weight loss, non-intentional [R63.4] 08/21/2012  . Status post tooth extraction 3 weeks ago [K08.409] 08/21/2012  . Abnormal TSH [R79.89] 08/21/2012  . Hypotension [I95.9] 08/20/2012  . ARF (acute renal failure) pre renal with tubular necrosis [N17.0] 08/20/2012  . Dementia [F03.90] 08/20/2012  . Dehydration [E86.0] 08/20/2012  . Diarrhea [R19.7] 08/20/2012  . Hyperkalemia [E87.5] 08/20/2012  . Hyponatremia [E87.1] 08/20/2012    Total Time spent with patient: 30 minutes  Musculoskeletal: Strength & Muscle Tone: within normal limits Gait & Station: normal Patient leans: N/A  Psychiatric Specialty Exam:   Blood pressure 117/75, pulse 84, temperature 98.7 F (37.1 C), temperature source Oral, resp. rate 18, height 6' (1.829 m), weight 63.5 kg (140 lb), SpO2 98 %.Body mass index is 18.99 kg/m.  General Appearance: Casual  Eye Contact::  Good  Speech:  Clear and Coherent409  Volume:  Normal  Mood:  Depressed  Affect:  Congruent and Depressed  Thought Process:  Coherent, Goal Directed and Linear  Orientation:  Full (Time, Place, and Person)  Thought Content:  Logical  Suicidal Thoughts:  No  Homicidal Thoughts:  No  Memory:  Immediate;   Good Recent;   Good Remote;   Fair  Judgement:  Good  Insight:  Fair  Psychomotor Activity:  Normal  Concentration:  Good  Recall:  Good  Fund of Knowledge:Good  Language: Good  Akathisia:  No  Handed:  Right  AIMS (if indicated):     Assets:  Communication Skills Desire for Improvement Financial Resources/Insurance Housing Social Support  Sleep:     Cognition: WNL  ADL's:   Intact   Mental Status Per Nursing Assessment::   On Admission:   Suicidal ideation  Demographic Factors:  Male, Age 77 or older and Caucasian  Loss Factors: Financial problems/change in socioeconomic status  Historical Factors: Impulsivity  Risk Reduction Factors:   Sense of responsibility to family  Continued Clinical Symptoms:  Depression:   Impulsivity Alcohol/Substance Abuse/Dependencies  Cognitive Features That Contribute To Risk:  Closed-mindedness    Suicide Risk:  Minimal: No identifiable suicidal ideation.  Patients presenting with no risk factors but with morbid ruminations; may be classified as minimal risk based on the severity of the depressive symptoms    Plan Of Care/Follow-up recommendations:  Activity:  as tolerated Diet:  Heart Healthy  Laveda AbbeLaurie Britton Parks, NP 12/17/2017, 10:43 AM

## 2017-12-17 NOTE — ED Provider Notes (Signed)
[facility]-EMERGENCY DEPT Provider Note   CSN: [id number] Arrival date & time: 12/16/17  2155     History   Chief Complaint Chief Complaint  Patient presents with  . Suicidal  . Adjustment Disorder  . Depression    HPI Michael Weaver is a 77 y.o. male.  77 year old male with history of dementia, hypertension, alcohol abuse presents to the emergency department with family after expressing suicidal ideation 4 over the past 2 days. Daughter states that the patient has transitioned to living in their home after he was unable to afford his senior living arrangement. He has expressed thoughts of cutting his throat to kill himself. He does have a history of overdose in 2014. Patient states that he feels lonely when he is home alone. He states that he has had suicidal ideations on and off. He denies this currently. He reports being from a father who has a history of depression. He has continued drinking alcohol daily, usually 6 beers per day. He denies any tremors or agitation currently. No illicit drug use or HI. He has not been followed by a psychiatrist or therapist since his overdose 5 years ago.      Past Medical History:  Diagnosis Date  . Alcohol abuse, unspecified 11/11/2013  . Dementia   . Hypertension   . Thyroid disease     Patient Active Problem List   Diagnosis Date Noted  . Alcohol abuse, unspecified 11/11/2013  . Weight loss, non-intentional 08/21/2012  . Status post tooth extraction 3 weeks ago 08/21/2012  . Abnormal TSH 08/21/2012  . Hypotension 08/20/2012  . ARF (acute renal failure) pre renal with tubular necrosis 08/20/2012  . Dementia 08/20/2012  . Dehydration 08/20/2012  . Diarrhea 08/20/2012  . Hyperkalemia 08/20/2012  . Hyponatremia 08/20/2012    Past Surgical History:  Procedure Laterality Date  . DENTAL SURGERY    . EYE SURGERY    . FRACTURE SURGERY     knee  . HAND SURGERY         Home Medications    Prior to  Admission medications   Medication Sig Start Date End Date Taking? Authorizing Provider  cholecalciferol (VITAMIN D) 1000 UNITS tablet Take 1,000 Units by mouth daily.   Yes [provider]  donepezil (ARICEPT) 10 MG tablet Take 1 tablet (10 mg total) by mouth at bedtime. 11/11/13  Yes Dohmeier, Porfirio Mylararmen, MD  FLUoxetine (PROZAC) 20 MG capsule Take 20 mg by mouth daily.  10/20/13  Yes [provider]  levothyroxine (SYNTHROID, LEVOTHROID) 150 MCG tablet Take 125 mcg by mouth daily.    Yes [provider]  lisinopril (PRINIVIL,ZESTRIL) 10 MG tablet Take 10 mg by mouth daily.  10/20/13  Yes [provider]  Melatonin 1 MG TABS Take 1 tablet by mouth at bedtime.   Yes [provider]  ALPRAZolam (XANAX) 0.25 MG tablet Take 1 tablet (0.25 mg total) by mouth once. Patient not taking: Reported on 12/17/2017 11/11/13   Dohmeier, Porfirio Mylararmen, MD  citalopram (CELEXA) 40 MG tablet Take 0.5 tablets (20 mg total) by mouth daily. Patient not taking: Reported on 12/17/2017 08/23/12   Zannie CoveJoseph, Preetha, MD    Family History Family History  Problem Relation Age of Onset  . Breast cancer Sister     Social History Social History   Tobacco Use  . Smoking status: Former Smoker    Packs/day: 1.00    Years: 30.00    Pack years: 30.00    Types: Cigarettes  .  Smokeless tobacco: Never Used  Substance Use Topics  . Alcohol use: Yes  . Drug use: No     Allergies   Patient has no known allergies.   Review of Systems Review of Systems Ten systems reviewed and are negative for acute change, except as noted in the HPI.    Physical Exam Updated Vital Signs BP 140/89   Pulse 78   Temp 98 F (36.7 C) (Oral)   Resp 17   Ht 6' (1.829 m)   Wt 63.5 kg (140 lb)   SpO2 100%   BMI 18.99 kg/m   Physical Exam  Constitutional: He is oriented to person, place, and time. He appears well-developed and well-nourished. No distress.  Nontoxic appearing. Calm and cooperative.    HENT:  Head: Normocephalic and atraumatic.  Eyes: Conjunctivae and EOM are normal. No scleral icterus.  Neck: Normal range of motion.  Pulmonary/Chest: Effort normal. No respiratory distress.  Respirations even and unlabored  Musculoskeletal: Normal range of motion.  Neurological: He is alert and oriented to person, place, and time. He exhibits normal muscle tone. Coordination normal.  Skin: Skin is warm and dry. No rash noted. He is not diaphoretic. No erythema. No pallor.  Psychiatric: He has a normal mood and affect. His behavior is normal.  Denies current SI/HI  Nursing note and vitals reviewed.    ED Treatments / Results  Labs (all labs ordered are listed, but only abnormal results are displayed) Labs Reviewed  COMPREHENSIVE METABOLIC PANEL - Abnormal; Notable for the following components:      Result Value   Glucose, Bld 103 (*)    AST 44 (*)    All other components within normal limits  ETHANOL - Abnormal; Notable for the following components:   Alcohol, Ethyl (B) 25 (*)    All other components within normal limits  ACETAMINOPHEN LEVEL - Abnormal; Notable for the following components:   Acetaminophen (Tylenol), Serum <10 (*)    All other components within normal limits  CBC - Abnormal; Notable for the following components:   MCV 103.3 (*)    MCH 36.6 (*)    All other components within normal limits  SALICYLATE LEVEL  RAPID URINE DRUG SCREEN, HOSP PERFORMED    EKG  EKG Interpretation None       Radiology No results found.  Procedures Procedures (including critical care time)  Medications Ordered in ED Medications  LORazepam (ATIVAN) injection 0-4 mg (0 mg Intravenous Not Given 12/17/17 0027)    Or  LORazepam (ATIVAN) tablet 0-4 mg ( Oral See Alternative 12/17/17 0027)  LORazepam (ATIVAN) injection 0-4 mg (not administered)    Or  LORazepam (ATIVAN) tablet 0-4 mg (not administered)  thiamine (VITAMIN B-1) tablet 100 mg (not administered)    Or  thiamine  (B-1) injection 100 mg (not administered)  acetaminophen (TYLENOL) tablet 650 mg (not administered)     Initial Impression / Assessment and Plan / ED Course  I have reviewed the triage vital signs and the nursing notes.  Pertinent labs & imaging results that were available during my care of the patient were reviewed by me and considered in my medical decision making (see chart for details).     78 year old male presents to the emergency department for psychiatric evaluation after daughter notes concern for patient expressing suicidal ideations. The patient has been evaluated by TTS following medical clearance. They recommend psychiatric placement. Social work to assist in the morning. Disposition to be determined by oncoming ED provider.  Final Clinical Impressions(s) / ED Diagnoses   Final diagnoses:  Adjustment disorder with mixed disturbance of emotions and conduct  Alcohol abuse    ED Discharge Orders    None       Antony Madura, PA-C 12/17/17 6962    Azalia Bilis, MD 12/17/17 [phone]

## 2017-12-17 NOTE — BH Assessment (Signed)
[facility]  Per Juanetta BeetsJacqueline Norman, DO, this pt does not require psychiatric hospitalization at this time.  [facility]  No outpatient referrals are required.  Loni DollyErin Davenports, LCSW agrees facilitate return to the community.  Pt's nurse has been notified.  Doylene Canninghomas Kindrick Lankford, MA Triage Specialist [phone]

## 2017-12-17 NOTE — ED Provider Notes (Signed)
  Physical Exam  BP 114/65 (BP Location: Right Arm)   Pulse 72   Temp 98.4 F (36.9 C) (Oral)   Resp 18   Ht 6' (1.829 m)   Wt 63.5 kg (140 lb)   SpO2 99%   BMI 18.99 kg/m   Physical Exam  ED Course/Procedures     Procedures  MDM  Patient has been seen by peers support.  Does not want to services.  Likely discharge home.  However daughter is not going to pick him up.  D does not appear to have criteria to keep in the hospital right now.  Discharge home.       Michael Weaver, Michael Suares, MD 12/17/17 1743

## 2017-12-17 NOTE — Patient Outreach (Signed)
ED Peer Support Specialist Patient Intake (Complete at intake & 30-60 Day Follow-up)  Name: Michael Weaver  MRN: [id number]  Age: 77 y.o.   Date of Admission: 12/17/2017  Intake: Initial Comments:      Primary Reason Admitted: Pt and his family report symptoms of depression and SI.  Pt reports current SI with plans of blocking his airway and stopping himself from breathing.  Pt acknowledges symptoms of depression including isolation, insomnia, weight loss, irritability, restlessness, anger and hopelessness.  Pt denies HI and AVH.  Pt states his current stressors include the recent changes in his living situation and not currently living in his own place.  Pt currently lives with is daughter and son in law until he can move into another Psychologist, clinical.  Pt states that his daughter and son in law are supports to him.  Pt denies history of abuse and trauma.  Pt reports there is a family history of Schizophrenia, his father.  Pt has fair insight and judgement.  Pt denies any current legal issues.       Lab values: Alcohol/ETOH: Positive Positive UDS? No Amphetamines: No Barbiturates: No Benzodiazepines: No Cocaine: No Opiates: No Cannabinoids: No  Demographic information: Gender: Male Ethnicity: White Marital Status: Widowed Insurance Status: Medicare([employer]) Ecologist (Work Neurosurgeon, Physicist, medical, etc.:   Lives with: Adult child Living situation: House/Apartment  Reported Patient History: Patient reported health conditions: None Patient aware of HIV and hepatitis status: No  In past year, has patient visited ED for any reason? No  Number of ED visits:    Reason(s) for visit:    In past year, has patient been hospitalized for any reason? No  Number of hospitalizations:    Reason(s) for hospitalization:    In past year, has patient been arrested? No  Number of arrests:    Reason(s) for arrest:    In past year, has  patient been incarcerated? No  Number of incarcerations:    Reason(s) for incarceration:    In past year, has patient received medication-assisted treatment? No  In past year, patient received the following treatments: Other (comment)  In past year, has patient received any harm reduction services? No  Did this include any of the following?    In past year, has patient received care from a mental health provider for diagnosis other than SUD? No  In past year, is this first time patient has overdosed? No  Number of past overdoses:    In past year, is this first time patient has been hospitalized for an overdose? No  Number of hospitalizations for overdose(s):    Is patient currently receiving treatment for a mental health diagnosis? No  Patient reports experiencing difficulty participating in SUD treatment: No    Most important reason(s) for this difficulty?    Has patient received prior services for treatment? No  In past, patient has received services from following agencies:    Plan of Care:  Suggested follow up at these agencies/treatment centers: Individual therapy(was not intrested in treatment services.)  Other information:  CPSS Aaron Edelman and Jenny Reichmann met with Pt and he was not wanting any services from [facility]. Pt was asked if he wanted CPSS to look into assisting Pt with finding services. CPSS John offered a list of facilities that maybe helpful for Pt to look into on his own.   Aaron Edelman Keyera Hattabaugh, CPSS  12/17/2017 4:57 PM

## 2017-12-17 NOTE — ED Notes (Signed)
Informed patient that his daughter called earlier reporting she was not going to come at 16:00. Social work is trying to prepare for patient to be discharged.

## 2017-12-17 NOTE — ED Notes (Signed)
John, Peer Support is going to speak with patient and the ED provider will assess patient. Patient is aware of plan.

## 2017-12-17 NOTE — Progress Notes (Addendum)
4:00PM: CSW received call back from patients son-in-law, Michael Weaver, requesting patient speak with someone regarding detox. Per son-in-law, patient drinks every day and is unable to "safely take care of him self". CSW informed son-in-law peer-support would be consult to speak with patient however it would ultimately be the patients decision regarding what he wanted to do. CSW will follow up with family once peer-support meets with patient.   3:30pm: CSW informed patients daughter, Michael Weaver, stated patient is not able to return home with her. CSW attempted to contact Sindi at [phone] with no answer- voicemail left for return call. CSW spoke with patient via bedside regarding discharge plans- patient is able to answer all questions appropriately and states he would feel comfortable staying at a hotel however patient does not have access to his SS check at this time. Patient states his daughter "takes care of it". Patient also voices concerns with having no clothes. CSW will leave hand off for 2nd shift CSW and update EDP.   CSW spoke with patients daughter, Michael Weaver [phone], regarding discharge plans. CSW informed daughter patient cleared medically/ psychiatrically at this time. Daughter voiced concerns with patient returning to her house at this time due to statements made by patient. Per daughter, [facility] and is now living with daughter. Daughter is looking into new independent living arrangements at this time. CSW spoke with patient via bedside regarding daughters concerns- patient stated he was "lonely and bored" yesterday which is why he made those statements. Patient has no concerns with returning to daughters house. Daughter is able to pick patient up at 4PM.   Stacy GardnerErin Hailyn Zarr, [facility] Emergency Room Clinical Social Worker [phone]

## 2017-12-17 NOTE — ED Notes (Signed)
Pt ambulated out of the ED with daughter in no distress. They have a appointment with his PCP tomorrow.

## 2019-02-20 ENCOUNTER — Encounter (HOSPITAL_COMMUNITY): Payer: Self-pay | Admitting: *Deleted

## 2019-02-20 ENCOUNTER — Emergency Department (HOSPITAL_COMMUNITY): Payer: Medicare HMO

## 2019-02-20 ENCOUNTER — Other Ambulatory Visit: Payer: Self-pay

## 2019-02-20 ENCOUNTER — Inpatient Hospital Stay (HOSPITAL_COMMUNITY)
Admission: EM | Admit: 2019-02-20 | Discharge: 2019-02-25 | DRG: 470 | Disposition: A | Discharge status: Skilled Nursing Facility | Payer: Medicare HMO | Attending: Internal Medicine | Admitting: Internal Medicine

## 2019-02-20 DIAGNOSIS — N179 Acute kidney failure, unspecified: Secondary | ICD-10-CM | POA: Diagnosis present

## 2019-02-20 DIAGNOSIS — Z681 Body mass index (BMI) 19 or less, adult: Secondary | ICD-10-CM | POA: Diagnosis not present

## 2019-02-20 DIAGNOSIS — S7291XA Unspecified fracture of right femur, initial encounter for closed fracture: Secondary | ICD-10-CM | POA: Diagnosis present

## 2019-02-20 DIAGNOSIS — W07XXXA Fall from chair, initial encounter: Secondary | ICD-10-CM | POA: Diagnosis present

## 2019-02-20 DIAGNOSIS — I10 Essential (primary) hypertension: Secondary | ICD-10-CM | POA: Diagnosis present

## 2019-02-20 DIAGNOSIS — Z79899 Other long term (current) drug therapy: Secondary | ICD-10-CM

## 2019-02-20 DIAGNOSIS — D72829 Elevated white blood cell count, unspecified: Secondary | ICD-10-CM | POA: Diagnosis present

## 2019-02-20 DIAGNOSIS — Z66 Do not resuscitate: Secondary | ICD-10-CM | POA: Diagnosis present

## 2019-02-20 DIAGNOSIS — S72001A Fracture of unspecified part of neck of right femur, initial encounter for closed fracture: Principal | ICD-10-CM | POA: Diagnosis present

## 2019-02-20 DIAGNOSIS — Z87891 Personal history of nicotine dependence: Secondary | ICD-10-CM | POA: Diagnosis not present

## 2019-02-20 DIAGNOSIS — Z9889 Other specified postprocedural states: Secondary | ICD-10-CM

## 2019-02-20 DIAGNOSIS — E039 Hypothyroidism, unspecified: Secondary | ICD-10-CM | POA: Diagnosis present

## 2019-02-20 DIAGNOSIS — E440 Moderate protein-calorie malnutrition: Secondary | ICD-10-CM | POA: Diagnosis present

## 2019-02-20 DIAGNOSIS — F0390 Unspecified dementia without behavioral disturbance: Secondary | ICD-10-CM | POA: Diagnosis present

## 2019-02-20 DIAGNOSIS — Z79890 Hormone replacement therapy (postmenopausal): Secondary | ICD-10-CM

## 2019-02-20 DIAGNOSIS — F1010 Alcohol abuse, uncomplicated: Secondary | ICD-10-CM | POA: Diagnosis present

## 2019-02-20 LAB — BASIC METABOLIC PANEL
ANION GAP: 12 (ref 5–15)
BUN: 25 mg/dL — ABNORMAL HIGH (ref 8–23)
CHLORIDE: 105 mmol/L (ref 98–111)
CO2: 24 mmol/L (ref 22–32)
Calcium: 9.2 mg/dL (ref 8.9–10.3)
Creatinine, Ser: 1.28 mg/dL — ABNORMAL HIGH (ref 0.61–1.24)
GFR calc non Af Amer: 53 mL/min — ABNORMAL LOW (ref >60)
Glucose, Bld: 167 mg/dL — ABNORMAL HIGH (ref 70–99)
POTASSIUM: 4.3 mmol/L (ref 3.5–5.1)
Sodium: 141 mmol/L (ref 135–145)

## 2019-02-20 LAB — ALBUMIN: Albumin: 4.3 g/dL (ref 3.5–5.0)

## 2019-02-20 LAB — CBC WITH DIFFERENTIAL/PLATELET
Abs Immature Granulocytes: 0.11 10*3/uL — ABNORMAL HIGH (ref 0.00–0.07)
BASOS ABS: 0.1 10*3/uL (ref 0.0–0.1)
Basophils Relative: 0 %
Eosinophils Absolute: 0 10*3/uL (ref 0.0–0.5)
Eosinophils Relative: 0 %
HEMATOCRIT: 45.9 % (ref 39.0–52.0)
HEMOGLOBIN: 15.2 g/dL (ref 13.0–17.0)
Immature Granulocytes: 1 %
LYMPHS ABS: 0.5 10*3/uL — AB (ref 0.7–4.0)
LYMPHS PCT: 3 %
MCH: 33.4 pg (ref 26.0–34.0)
MCHC: 33.1 g/dL (ref 30.0–36.0)
MCV: 100.9 fL — ABNORMAL HIGH (ref 80.0–100.0)
Monocytes Absolute: 0.7 10*3/uL (ref 0.1–1.0)
Monocytes Relative: 4 %
NEUTROS ABS: 15.6 10*3/uL — AB (ref 1.7–7.7)
NEUTROS PCT: 92 %
NRBC: 0 % (ref 0.0–0.2)
Platelets: 180 10*3/uL (ref 150–400)
RBC: 4.55 MIL/uL (ref 4.22–5.81)
RDW: 12.5 % (ref 11.5–15.5)
WBC: 17 10*3/uL — ABNORMAL HIGH (ref 4.0–10.5)

## 2019-02-20 LAB — TYPE AND SCREEN
ABO/RH(D): O POS
Antibody Screen: NEGATIVE

## 2019-02-20 LAB — PROTIME-INR
INR: 0.9 (ref 0.8–1.2)
Prothrombin Time: 12.4 seconds (ref 11.4–15.2)

## 2019-02-20 MED ORDER — LEVOTHYROXINE SODIUM 75 MCG PO TABS
150.0000 ug | ORAL_TABLET | Freq: Every day | ORAL | Status: DC
  Administered 2019-02-21: 150 ug via ORAL
  Filled 2019-02-20: qty 2

## 2019-02-20 MED ORDER — ENSURE ENLIVE PO LIQD: 237.0000 mL | Freq: Two times a day (BID) | ORAL | Status: DC

## 2019-02-20 MED ORDER — MORPHINE SULFATE (PF) 4 MG/ML IV SOLN
4.0000 mg | Freq: Once | INTRAVENOUS | Status: AC
  Administered 2019-02-20: 4 mg via INTRAVENOUS
  Filled 2019-02-20: qty 1

## 2019-02-20 MED ORDER — METHOCARBAMOL 500 MG PO TABS
500.0000 mg | ORAL_TABLET | Freq: Four times a day (QID) | ORAL | Status: DC | PRN
  Administered 2019-02-20 – 2019-02-21 (×2): 500 mg via ORAL
  Filled 2019-02-20 (×2): qty 1

## 2019-02-20 MED ORDER — HYDROCODONE-ACETAMINOPHEN 5-325 MG PO TABS
1.0000 | ORAL_TABLET | Freq: Four times a day (QID) | ORAL | Status: DC | PRN
  Administered 2019-02-20 – 2019-02-21 (×2): 2 via ORAL
  Filled 2019-02-20 (×2): qty 2

## 2019-02-20 MED ORDER — CHLORHEXIDINE GLUCONATE 4 % EX LIQD: 60.0000 mL | Freq: Once | CUTANEOUS | Status: DC

## 2019-02-20 MED ORDER — FLUOXETINE HCL 20 MG PO CAPS: 40.0000 mg | ORAL_CAPSULE | Freq: Every day | ORAL | Status: DC

## 2019-02-20 MED ORDER — SODIUM CHLORIDE 0.9 % IV SOLN: INTRAVENOUS | Status: DC

## 2019-02-20 MED ORDER — DONEPEZIL HCL 10 MG PO TABS
10.0000 mg | ORAL_TABLET | Freq: Every day | ORAL | Status: DC
  Filled 2019-02-20: qty 1

## 2019-02-20 MED ORDER — SODIUM CHLORIDE 0.9 % IV SOLN
INTRAVENOUS | Status: DC
  Administered 2019-02-20: 23:00:00 via INTRAVENOUS

## 2019-02-20 MED ORDER — MORPHINE SULFATE (PF) 2 MG/ML IV SOLN: 0.5000 mg | INTRAVENOUS | Status: DC | PRN

## 2019-02-20 MED ORDER — METHOCARBAMOL 1000 MG/10ML IJ SOLN
500.0000 mg | Freq: Four times a day (QID) | INTRAVENOUS | Status: DC | PRN
  Filled 2019-02-20: qty 5

## 2019-02-20 MED ORDER — SENNA 8.6 MG PO TABS
1.0000 | ORAL_TABLET | Freq: Two times a day (BID) | ORAL | Status: DC
  Administered 2019-02-20: 8.6 mg via ORAL
  Filled 2019-02-20: qty 1

## 2019-02-20 MED ORDER — CEFAZOLIN SODIUM-DEXTROSE 2-4 GM/100ML-% IV SOLN
2.0000 g | INTRAVENOUS | Status: AC
  Administered 2019-02-21: 2 g via INTRAVENOUS

## 2019-02-20 MED ORDER — POVIDONE-IODINE 10 % EX SWAB: 2.0000 "application " | Freq: Once | CUTANEOUS | Status: DC

## 2019-02-20 NOTE — ED Provider Notes (Signed)
[facility]-EMERGENCY DEPT Provider Note   CSN: [id number] Arrival date & time: 02/20/19  1524    History   Chief Complaint Chief Complaint  Patient presents with  . Hip Pain    HPI Michael Weaver is a 78 y.o. male who presents with hip pain. PMH signficant for dementia, hypertension, alcohol abuse, thyroid disease. He states that he was at his independent living facility and got up to change out a DVD and fell. He reports immediate pain in the R hip. He states he thinks he might of sprained it. The pain is constant and worse with movement. He otherwise denies any complaints. No headache, chest pain, SOB, abdominal pain, L knee pain, R leg pain. He is not on blood thinners.      HPI  Past Medical History:  Diagnosis Date  . Alcohol abuse, unspecified 11/11/2013  . Dementia   . Hypertension   . Thyroid disease     Patient Active Problem List   Diagnosis Date Noted  . 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 Alcohol abuse, unspecified 11/11/2013  . Weight loss, non-intentional 08/21/2012  . Status post tooth extraction 3 weeks ago 08/21/2012  . Abnormal TSH 08/21/2012  . Hypotension 08/20/2012  . ARF (acute renal failure) pre renal with tubular necrosis 08/20/2012  . Dementia (HCC) 08/20/2012  . Dehydration 08/20/2012  . Diarrhea 08/20/2012  . Hyperkalemia 08/20/2012  . Hyponatremia 08/20/2012    Past Surgical History:  Procedure Laterality Date  . DENTAL SURGERY    . EYE SURGERY    . FRACTURE SURGERY     knee  . HAND SURGERY          Home Medications    Prior to Admission medications   Medication Sig Start Date End Date Taking? Authorizing Provider  ALPRAZolam (XANAX) 0.25 MG tablet Take 1 tablet (0.25 mg total) by mouth once. Patient not taking: Reported on 12/17/2017 11/11/13   Dohmeier, Porfirio Mylar, MD  cholecalciferol (VITAMIN D) 1000 UNITS tablet Take 1,000 Units by mouth daily.    [provider]  citalopram  (CELEXA) 40 MG tablet Take 0.5 tablets (20 mg total) by mouth daily. Patient not taking: Reported on 12/17/2017 08/23/12   Zannie Cove, MD  donepezil (ARICEPT) 10 MG tablet Take 1 tablet (10 mg total) by mouth at bedtime. 11/11/13   Dohmeier, Porfirio Mylar, MD  FLUoxetine (PROZAC) 20 MG capsule Take 20 mg by mouth daily.  10/20/13   [provider]  levothyroxine (SYNTHROID, LEVOTHROID) 150 MCG tablet Take 125 mcg by mouth daily.     [provider]  lisinopril (PRINIVIL,ZESTRIL) 10 MG tablet Take 10 mg by mouth daily.  10/20/13   [provider]  Melatonin 1 MG TABS Take 1 tablet by mouth at bedtime.    [provider]    Family History Family History  Problem Relation Age of Onset  . Breast cancer Sister     Social History Social History   Tobacco Use  . Smoking status: Former Smoker    Packs/day: 1.00    Years: 30.00    Pack years: 30.00    Types: Cigarettes  . Smokeless tobacco: Never Used  Substance Use Topics  . Alcohol use: Yes  . Drug use: No     Allergies   Patient has no known allergies.   Review of Systems Review of Systems  Respiratory: Negative for shortness of breath.   Cardiovascular: Negative for chest pain.  Gastrointestinal: Negative for abdominal  pain.  Musculoskeletal: Positive for arthralgias and myalgias. Negative for back pain.  All other systems reviewed and are negative.    Physical Exam Updated Vital Signs BP 121/79   Pulse 71   Resp (!) 21   SpO2 100%   Physical Exam Vitals signs and nursing note reviewed.  Constitutional:      General: He is not in acute distress.    Appearance: Normal appearance. He is well-developed. He is not ill-appearing.     Comments: Elderly male in NAD. Mildly confused but conversant  HENT:     Head: Normocephalic and atraumatic.  Eyes:     General: No scleral icterus.       Right eye: No discharge.        Left eye: No discharge.     Conjunctiva/sclera: Conjunctivae  normal.     Pupils: Pupils are equal, round, and reactive to light.  Neck:     Musculoskeletal: Normal range of motion.  Cardiovascular:     Rate and Rhythm: Normal rate and regular rhythm.  Pulmonary:     Effort: Pulmonary effort is normal. No respiratory distress.     Breath sounds: Normal breath sounds.  Abdominal:     General: There is no distension.     Palpations: Abdomen is soft.     Tenderness: There is no abdominal tenderness.  Musculoskeletal:     Comments: Right hip: Tenderness over hip. Unable to range. 2+ DP pulse.   Right knee: Old surgical scar noted. No tenderness  Left lower extremity: FROM of hip and knee. Intact distal pulse  Skin:    General: Skin is warm and dry.  Neurological:     Mental Status: He is alert. Mental status is at baseline. He is confused.     GCS: GCS eye subscore is 4. GCS verbal subscore is 5. GCS motor subscore is 6.     Comments: Mild fasiculation of muscles of lower extremities      ED Treatments / Results  Labs (all labs ordered are listed, but only abnormal results are displayed) Labs Reviewed  BASIC METABOLIC PANEL - Abnormal; Notable for the following components:      Result Value   Glucose, Bld 167 (*)    BUN 25 (*)    Creatinine, Ser 1.28 (*)    GFR calc non Af Amer 53 (*)    All other components within normal limits  CBC WITH DIFFERENTIAL/PLATELET - Abnormal; Notable for the following components:   WBC 17.0 (*)    MCV 100.9 (*)    Neutro Abs 15.6 (*)    Lymphs Abs 0.5 (*)    Abs Immature Granulocytes 0.11 (*)    All other components within normal limits  PROTIME-INR  TYPE AND SCREEN    EKG None  Radiology Dg Chest 2 View  Result Date: 02/20/2019 CLINICAL DATA:  78 year old male status post mechanical fall with right hip pain EXAM: CHEST - 2 VIEW COMPARISON:  Concurrently obtained radiographs of the right hip FINDINGS: The patient is rotated toward the right which slightly distorts the cardiac and mediastinal  contours. Within these limitations, the cardiac and mediastinal contours are likely within normal limits. Inspiratory volumes are low. There is mild pulmonary vascular congestion but no overt edema. No pleural effusion or pneumothorax. No evidence of acute fracture. IMPRESSION: 1. Low lung volumes with mild pulmonary vascular congestion but no interstitial edema. Electronically Signed   By: Malachy Moan M.D.   On: 02/20/2019 16:33   Dg  Hip Unilat W Or Wo Pelvis 2-3 Views Right  Result Date: 02/20/2019 CLINICAL DATA:  Right hip pain after fall. EXAM: DG HIP (WITH OR WITHOUT PELVIS) 2-3V RIGHT COMPARISON:  None. FINDINGS: Moderately displaced fracture is seen involving the proximal right femoral neck. Left hip is unremarkable. IMPRESSION: Moderately displaced proximal right femoral neck fracture. Electronically Signed   By: Lupita Raider, M.D.   On: 02/20/2019 16:32    Procedures Procedures (including critical care time)  Medications Ordered in ED Medications  morphine 4 MG/ML injection 4 mg (4 mg Intravenous Given 02/20/19 1712)  morphine 4 MG/ML injection 4 mg (4 mg Intravenous Given 02/20/19 1835)     Initial Impression / Assessment and Plan / ED Course  I have reviewed the triage vital signs and the nursing notes.  Pertinent labs & imaging results that were available during my care of the patient were reviewed by me and considered in my medical decision making (see chart for details).  78 year old male presents with a fall and R hip injury at his independent living facility. Vitals are normal. On exam he has hip tenderness. R leg is shortened and rotated. He has an intact DP pulse. Will order hip fx protocol.  Xray confirms proximal R femoral neck fracture. CBC is remarkable for leukocytosis which is likely reactive. BMP is remarkable for elevated SCr (1.28) and hyperglycemia (167). We have no recent results to compare so unclear if this is progression of CKD vs dehydration. CXR shows  low lung volumes. EKG is SR. Discussed with Dr. Ophelia Charter with Ortho who will see pt. Discussed with Dr. Adela Glimpse who will admit.   Final Clinical Impressions(s) / ED Diagnoses   Final diagnoses:  Closed fracture of right hip, initial encounter [facility])    ED Discharge Orders    None       Bethel Born, PA-C 02/20/19 1931    Benjiman Core, MD 02/20/19 2355

## 2019-02-20 NOTE — H&P (Signed)
Michael Weaver JTT:[id number] DOB: 1941-02-22 DOA: 02/20/2019     PCP: Alois Cliche, PA-C   Outpatient Specialists:    NEurology    Dr.Dohmeier, M D     Patient arrived to ER on 02/20/19 at 1524  Patient coming from:   From facility  independent living [facility] [address]  Chief Complaint:  Chief Complaint  Patient presents with   Hip Pain    HPI: DADRIEN Weaver is a 78 y.o. male with medical history significant of hypertension and dementia hypothyroidism    Presented with   Mechanical fall as he slid out of the chair. Fell back wards no head injury.  Noted to have right away right hip pain with deformity and shortening EMS was called patient was given 100 mcg of fentanyl in route heart rate 70 blood pressure 124/70 satting 99% room air  Able to walk well with out any chest pain or shortness of breath no prior hx of CAD or lung disease    Regarding pertinent Chronic problems:  History of hypertension on lisinopril  history of hypothyroidism on Synthroid  While in ER: Found to have moderately displaced proximal right femoral neck fracture discussed with orthopedics plan to take to the OR in a.m. The following Work up has been ordered so far:  Orders Placed This Encounter  Procedures   DG Hip Unilat W or Wo Pelvis 2-3 Views Right   DG Chest 2 View   Basic metabolic panel   CBC with Differential   Protime-INR   Diet NPO time specified   Check CMS   Bed rest   Initiate Carrier Fluid Protocol   Consult to orthopedic surgery   Consult to hospitalist   Inpatient consult to Social Work   ED EKG   EKG 12-Lead   EKG 12-Lead   Type and screen [facility]     Following Medications were ordered in ER: Medications  morphine 4 MG/ML injection 4 mg (4 mg Intravenous Given 02/20/19 1712)  morphine 4 MG/ML injection 4 mg (4 mg Intravenous Given 02/20/19 1835)    Significant initial  Findings: Abnormal Labs Reviewed  BASIC  METABOLIC PANEL - Abnormal; Notable for the following components:      Result Value   Glucose, Bld 167 (*)    BUN 25 (*)    Creatinine, Ser 1.28 (*)    GFR calc non Af Amer 53 (*)    All other components within normal limits  CBC WITH DIFFERENTIAL/PLATELET - Abnormal; Notable for the following components:   WBC 17.0 (*)    MCV 100.9 (*)    Neutro Abs 15.6 (*)    Lymphs Abs 0.5 (*)    Abs Immature Granulocytes 0.11 (*)    All other components within normal limits    Lactic Acid, Venous    Component Value Date/Time   LATICACIDVEN 1.4 08/20/2012 1733    Na 141 K 4.3  Cr   Up from baseline see below Lab Results  Component Value Date   CREATININE 1.28 (H) 02/20/2019   CREATININE 1.01 12/16/2017   CREATININE 1.19 09/07/2013    WBC 17  HG/HCT   Stable     Component Value Date/Time   HGB 15.2 02/20/2019 1603   HGB 16.2 06/25/2007 1425   HCT 45.9 02/20/2019 1603   HCT 45.1 06/25/2007 1425    INR 0.9    UA  not ordered   CXR -  NON acute   ECG:  Personally  reviewed by me showing: HR : 84 Rhythm: NSR,   no evidence of ischemic changes QTC 470      ED Triage Vitals  Enc Vitals Group     BP 02/20/19 1536 124/70     Pulse Rate 02/20/19 1536 70     Resp 02/20/19 1600 (!) 21     Temp 02/20/19 1607 98.1 F (36.7 C)     Temp Source 02/20/19 1607 Oral     SpO2 02/20/19 1536 99 %     Weight --      Height --      Head Circumference --      Peak Flow --      Pain Score 02/20/19 1543 7     Pain Loc --      Pain Edu? --      Excl. in GC? --   TMAX(24)@       Latest  Blood pressure 127/80, pulse 84, temperature 98.1 F (36.7 C), temperature source Oral, resp. rate 18, SpO2 99 %.    ER Provider Called:     Dr. Ophelia Charter They Recommend admit to medicine plan for OR in a.m. Will see in AM   Hospitalist was called for admission for femoral neck fracture   Review of Systems:    Pertinent positives include: Right hip pain  Constitutional:  No weight loss,  night sweats, Fevers, chills, fatigue, weight loss  HEENT:  No headaches, Difficulty swallowing,Tooth/dental problems,Sore throat,  No sneezing, itching, ear ache, nasal congestion, post nasal drip,  Cardio-vascular:  No chest pain, Orthopnea, PND, anasarca, dizziness, palpitations.no Bilateral lower extremity swelling  GI:  No heartburn, indigestion, abdominal pain, nausea, vomiting, diarrhea, change in bowel habits, loss of appetite, melena, blood in stool, hematemesis Resp:  no shortness of breath at rest. No dyspnea on exertion, No excess mucus, no productive cough, No non-productive cough, No coughing up of blood.No change in color of mucus.No wheezing. Skin:  no rash or lesions. No jaundice GU:  no dysuria, change in color of urine, no urgency or frequency. No straining to urinate.  No flank pain.  Musculoskeletal:  No joint pain or no joint swelling. No decreased range of motion. No back pain.  Psych:  No change in mood or affect. No depression or anxiety. No memory loss.  Neuro: no localizing neurological complaints, no tingling, no weakness, no double vision, no gait abnormality, no slurred speech, no confusion  All systems reviewed and apart from HOPI all are negative  Past Medical History:   Past Medical History:  Diagnosis Date   Alcohol abuse, unspecified 11/11/2013   Dementia    Hypertension    Thyroid disease       Past Surgical History:  Procedure Laterality Date   DENTAL SURGERY     EYE SURGERY     FRACTURE SURGERY     knee   HAND SURGERY      Social History:  Ambulatory  independently      reports that he has quit smoking. His smoking use included cigarettes. He has a 30.00 pack-year smoking history. He has never used smokeless tobacco. He reports current alcohol use. He reports that he does not use drugs.    Family History:   Family History  Problem Relation Age of Onset   Breast cancer Sister     Allergies: No Known  Allergies   Prior to Admission medications   Medication Sig Start Date End Date Taking? Authorizing Provider  donepezil (ARICEPT) 10 MG  tablet Take 1 tablet (10 mg total) by mouth at bedtime. 11/11/13  Yes Dohmeier, Porfirio Mylar, MD  FLUoxetine (PROZAC) 40 MG capsule Take 40 mg by mouth daily.  01/07/19  Yes [provider]  levothyroxine (SYNTHROID, LEVOTHROID) 150 MCG tablet Take 150 mcg by mouth daily.    Yes [provider]  lisinopril (PRINIVIL,ZESTRIL) 10 MG tablet Take 10 mg by mouth daily.  10/20/13  Yes [provider]  Melatonin 10 MG TABS Take 1 tablet by mouth at bedtime.   Yes [provider]  Multiple Vitamins-Minerals (MULTIVITAMIN ADULT) TABS Take 1 tablet by mouth daily.   Yes [provider]  ALPRAZolam (XANAX) 0.25 MG tablet Take 1 tablet (0.25 mg total) by mouth once. Patient not taking: Reported on 12/17/2017 11/11/13   Dohmeier, Porfirio Mylar, MD  citalopram (CELEXA) 40 MG tablet Take 0.5 tablets (20 mg total) by mouth daily. Patient not taking: Reported on 12/17/2017 08/23/12   Zannie Cove, MD   Physical Exam: Blood pressure 127/80, pulse 84, temperature 98.1 F (36.7 C), temperature source Oral, resp. rate 18, SpO2 99 %. 1. General:  in No  Acute distress   Chronically ill  -appearing 2. Psychological: Alert and  Oriented to self  3. Head/ENT:  Dry Mucous Membranes                          Head Non traumatic, neck supple                           Poor Dentition 4. SKIN:  decreased Skin turgor,  Skin clean Dry and intact no rash multiple excoriations 5. Heart: Regular rate and rhythm no  Murmur, no Rub or gallop 6. Lungs: Clear to auscultation bilaterally, no wheezes or crackles   7. Abdomen: Soft,  non-tender, Non distended bowel sounds present 8. Lower extremities: no clubbing, cyanosis, no  edema 9. Neurologically Grossly intact, moving all 4 extremities equally   10. MSK: Normal range of motion limited due pain with right hip  shortening   LABS:     Recent Labs  Lab 02/20/19 1603  WBC 17.0*  NEUTROABS 15.6*  HGB 15.2  HCT 45.9  MCV 100.9*  PLT 180   Basic Metabolic Panel: Recent Labs  Lab 02/20/19 1603  NA 141  K 4.3  CL 105  CO2 24  GLUCOSE 167*  BUN 25*  CREATININE 1.28*  CALCIUM 9.2      No results for input(s): AST, ALT, ALKPHOS, BILITOT, PROT, ALBUMIN in the last 168 hours. No results for input(s): LIPASE, AMYLASE in the last 168 hours. No results for input(s): AMMONIA in the last 168 hours.    HbA1C: No results for input(s): HGBA1C in the last 72 hours. CBG: No results for input(s): GLUCAP in the last 168 hours.    Urine analysis:    Component Value Date/Time   COLORURINE AMBER (A) 08/20/2012 1841   APPEARANCEUR CLOUDY (A) 08/20/2012 1841   LABSPEC 1.020 08/20/2012 1841   PHURINE 5.0 08/20/2012 1841   GLUCOSEU NEGATIVE 08/20/2012 1841   HGBUR NEGATIVE 08/20/2012 1841   BILIRUBINUR SMALL (A) 08/20/2012 1841   KETONESUR 15 (A) 08/20/2012 1841   PROTEINUR NEGATIVE 08/20/2012 1841   UROBILINOGEN 0.2 08/20/2012 1841   NITRITE NEGATIVE 08/20/2012 1841   LEUKOCYTESUR TRACE (A) 08/20/2012 1841     Cultures:    Component Value Date/Time   SDES STOOL 08/21/2012 0617   SDES  STOOL 08/21/2012 0617   SPECREQUEST NONE 08/21/2012 0617   SPECREQUEST Normal 08/21/2012 0617   CULT  08/21/2012 0617    NO SALMONELLA, SHIGELLA, CAMPYLOBACTER, YERSINIA, OR E.COLI 0157:H7 ISOLATED   REPTSTATUS 08/22/2012 FINAL 08/21/2012 0617   REPTSTATUS 08/24/2012 FINAL 08/21/2012 0617     Radiological Exams on Admission: Dg Chest 2 View  Result Date: 02/20/2019 CLINICAL DATA:  78 year old male status post mechanical fall with right hip pain EXAM: CHEST - 2 VIEW COMPARISON:  Concurrently obtained radiographs of the right hip FINDINGS: The patient is rotated toward the right which slightly distorts the cardiac and mediastinal contours. Within these limitations, the cardiac and mediastinal contours are  likely within normal limits. Inspiratory volumes are low. There is mild pulmonary vascular congestion but no overt edema. No pleural effusion or pneumothorax. No evidence of acute fracture. IMPRESSION: 1. Low lung volumes with mild pulmonary vascular congestion but no interstitial edema. Electronically Signed   By: Malachy Moan M.D.   On: 02/20/2019 16:33   Dg Hip Unilat W Or Wo Pelvis 2-3 Views Right  Result Date: 02/20/2019 CLINICAL DATA:  Right hip pain after fall. EXAM: DG HIP (WITH OR WITHOUT PELVIS) 2-3V RIGHT COMPARISON:  None. FINDINGS: Moderately displaced fracture is seen involving the proximal right femoral neck. Left hip is unremarkable. IMPRESSION: Moderately displaced proximal right femoral neck fracture. Electronically Signed   By: Lupita Raider, M.D.   On: 02/20/2019 16:32    Chart has been reviewed   Assessment/Plan  78 y.o. male with medical history significant of hypertension and dementia hypothyroidism     Admitted for Right Femoral neck fracture  Present on Admission:  Closed displaced fracture of right femoral neck (HCC) -  - management as per orthopedics,  plan to operate   in  a.m.   Keep nothing by mouth post midnight. Patient   not on anticoagulation or antiplatelet agents   Ordered type and screen, Place Foley, order a vitamin D level  Patient at baseline  able to walk a flight of stairs or 100 feet   .   Patient denies any chest pain or shortness of breath currently and/or with exertion,  ECG showing no evidence of acute ischemia  no known history of coronary artery disease,   COPD  Liver failure   CKD  Given advanced age patient is at least moderate  risk  which has been discussed with family but at this point no furthther cardiac workup is indicated.      AKI (acute kidney injury) (HCC) - mild will gently rehydrate and continue to follow  Leukocytosis -in the setting of recent fall and fracture otherwise no underlying infectious source noted   Essential hypertension -restart lisinopril once creatinine improves back to baseline  Hypothyroidism we will continue home medications check TSH       Other plan as per orders.  DVT prophylaxis:  SCD   Code Status:   DNR/DNI as per patient  I had personally discussed CODE STATUS with patient and family   Family Communication:   Family  at  Bedside  plan of care was discussed with  Daughter,   Disposition Plan:     likely will need placement for rehabilitation                                    Would benefit from PT/OT eval prior to DC order when stable  Swallow eval - SLP ordered                   Social Work  consulted                                      Consults called: Orthopedics Dr. Ophelia Charter  Admission status:   inpatient     Expect 2 midnight stay secondary to severity of patient's current illness including      Severe lab/radiological/exam abnormalities including:  Right femoral neck fracture   and extensive comorbidities including:  dementia  That are currently affecting medical management.   I expect  patient to be hospitalized for 2 midnights requiring inpatient medical care.  Patient is at high risk for adverse outcome (such as loss of life or disability) if not treated.  Indication for inpatient stay as follows:    severe pain requiring acute inpatient management,  inability to maintain oral hydration  Need for operative/procedural  intervention    Need for  IV fluids,  IV pain medications      Level of care      medical floor         Therisa Doyne 02/20/2019, 7:55 PM    [facility]     after 2 AM please page floor coverage PA If 7AM-7PM, please contact the day team taking care of the patient using [email or web address]

## 2019-02-20 NOTE — ED Triage Notes (Signed)
From independent living [address] off [address]. Mech fall, slid out of chair. Rt hip pain with deforming with shortening. 100 fentanyl.   124/70 HR70 99%

## 2019-02-20 NOTE — ED Notes (Signed)
Bed: DG38 Expected date:  Expected time:  Means of arrival:  Comments: Hip injury

## 2019-02-20 NOTE — Progress Notes (Signed)
Will see pt in AM, posted for hip hemiarthroplasty at about 9-10 AM on Saturday.  My cell [phone]

## 2019-02-20 NOTE — ED Notes (Signed)
ED TO INPATIENT HANDOFF REPORT  ED Nurse Name and Phone #: Annarae Macnair [phone]  S Name/Age/Gender Michael Weaver 78 y.o. male Room/Bed: WA25/WA25  Code Status   Code Status: Prior  Home/SNF/Other Rehab Patient oriented to: self Is this baseline? Yes   Triage Complete: Triage complete  Chief Complaint Right Hip Injury  Triage Note From independent living [facility] [address]. Mech fall, slid out of chair. Rt hip pain with deforming with shortening. 100 fentanyl.   124/70 HR70 99%    Allergies No Known Allergies  Level of Care/Admitting Diagnosis ED Disposition    ED Disposition Condition Comment   Admit  Hospital Area: [facility] [[id number]]  Level of Care: Med-Surg [16]  Diagnosis: Right femoral fracture [facility]) [[id number]]  Admitting Physician: Therisa Doyne [[id number]]  Attending Physician: Therisa Doyne [[id number]]  Estimated length of stay: 3 - 4 days  Certification:: I certify this patient will need inpatient services for at least 2 midnights  PT Class (Do Not Modify): Inpatient [101]  PT Acc Code (Do Not Modify): Private [1]       B Medical/Surgery History Past Medical History:  Diagnosis Date  . Alcohol abuse, unspecified 11/11/2013  . Dementia (HCC)   . Hypertension   . Thyroid disease    Past Surgical History:  Procedure Laterality Date  . DENTAL SURGERY    . EYE SURGERY    . FRACTURE SURGERY     knee  . HAND SURGERY       A IV Location/Drains/Wounds Patient Lines/Drains/Airways Status   Active Line/Drains/Airways    Name:   Placement date:   Placement time:   Site:   Days:   Peripheral IV 02/20/19 Left Forearm   02/20/19    1535    Forearm   less than 1          Intake/Output Last 24 hours No intake or output data in the 24 hours ending 02/20/19 1924  Labs/Imaging Results for orders placed or performed during the hospital encounter of 02/20/19 (from the past 48 hour(s))  Basic metabolic panel     Status: Abnormal    Collection Time: 02/20/19  4:03 PM  Result Value Ref Range   Sodium 141 135 - 145 mmol/L   Potassium 4.3 3.5 - 5.1 mmol/L   Chloride 105 98 - 111 mmol/L   CO2 24 22 - 32 mmol/L   Glucose, Bld 167 (H) 70 - 99 mg/dL   BUN 25 (H) 8 - 23 mg/dL   Creatinine, Ser 3.71 (H) 0.61 - 1.24 mg/dL   Calcium 9.2 8.9 - 06.2 mg/dL   GFR calc non Af Amer 53 (L) >60 mL/min   GFR calc Af Amer >60 >60 mL/min   Anion gap 12 5 - 15    Comment: Performed at [facility], [address] [place], [place] [address]  CBC with Differential     Status: Abnormal   Collection Time: 02/20/19  4:03 PM  Result Value Ref Range   WBC 17.0 (H) 4.0 - 10.5 K/uL   RBC 4.55 4.22 - 5.81 MIL/uL   Hemoglobin 15.2 13.0 - 17.0 g/dL   HCT 46.2 70.3 - 50.0 %   MCV 100.9 (H) 80.0 - 100.0 fL   MCH 33.4 26.0 - 34.0 pg   MCHC 33.1 30.0 - 36.0 g/dL   RDW 93.8 18.2 - 99.3 %   Platelets 180 150 - 400 K/uL   nRBC 0.0 0.0 - 0.2 %   Neutrophils  Relative % 92 %   Neutro Abs 15.6 (H) 1.7 - 7.7 K/uL   Lymphocytes Relative 3 %   Lymphs Abs 0.5 (L) 0.7 - 4.0 K/uL   Monocytes Relative 4 %   Monocytes Absolute 0.7 0.1 - 1.0 K/uL   Eosinophils Relative 0 %   Eosinophils Absolute 0.0 0.0 - 0.5 K/uL   Basophils Relative 0 %   Basophils Absolute 0.1 0.0 - 0.1 K/uL   Immature Granulocytes 1 %   Abs Immature Granulocytes 0.11 (H) 0.00 - 0.07 K/uL    Comment: Performed at [facility], [address], [place], [place] [address]  Protime-INR     Status: None   Collection Time: 02/20/19  4:51 PM  Result Value Ref Range   Prothrombin Time 12.4 11.4 - 15.2 seconds   INR 0.9 0.8 - 1.2    Comment: (NOTE) INR goal varies based on device and disease states. Performed at [facility], [address], [place], [place] [address]    Dg Chest 2 View  Result Date: 02/20/2019 CLINICAL DATA:  78 year old male status post mechanical fall with right hip pain EXAM: CHEST - 2 VIEW COMPARISON:   Concurrently obtained radiographs of the right hip FINDINGS: The patient is rotated toward the right which slightly distorts the cardiac and mediastinal contours. Within these limitations, the cardiac and mediastinal contours are likely within normal limits. Inspiratory volumes are low. There is mild pulmonary vascular congestion but no overt edema. No pleural effusion or pneumothorax. No evidence of acute fracture. IMPRESSION: 1. Low lung volumes with mild pulmonary vascular congestion but no interstitial edema. Electronically Signed   By: Malachy Moan M.D.   On: 02/20/2019 16:33   Dg Hip Unilat W Or Wo Pelvis 2-3 Views Right  Result Date: 02/20/2019 CLINICAL DATA:  Right hip pain after fall. EXAM: DG HIP (WITH OR WITHOUT PELVIS) 2-3V RIGHT COMPARISON:  None. FINDINGS: Moderately displaced fracture is seen involving the proximal right femoral neck. Left hip is unremarkable. IMPRESSION: Moderately displaced proximal right femoral neck fracture. Electronically Signed   By: Lupita Raider, M.D.   On: 02/20/2019 16:32    Pending Labs Unresulted Labs (From admission, onward)    Start     Ordered   02/20/19 1651  Type and screen [facility]  ONCE - STAT,   STAT    Comments:  [facility]    02/20/19 1650   Signed and Held  CBC  Tomorrow morning,   R     Signed and Held   Signed and Held  Basic metabolic panel  Tomorrow morning,   R     Signed and Held   Signed and Held  Albumin  Add-on,   R     Signed and Held   Signed and Held  VITAMIN D 25 Hydroxy (Vit-D Deficiency, Fractures)  Add-on,   R     Signed and Held          Vitals/Pain Today's Vitals   02/20/19 1700 02/20/19 1740 02/20/19 1800 02/20/19 1830  BP: 134/77  134/78 127/80  Pulse: 84  87 84  Resp: (!) Temp:      TempSrc:      SpO2: 99%  97% 99%  PainSc:  8       Isolation Precautions No active isolations  Medications Medications  morphine 4 MG/ML injection 4 mg (4 mg  Intravenous Given 02/20/19 1712)  morphine 4  MG/ML injection 4 mg (4 mg Intravenous Given 02/20/19 1835)    Mobility power wheelchair High fall risk   Focused Assessments    R Recommendations: See Admitting Provider Note  Report given to:   Additional Notes:

## 2019-02-20 NOTE — Plan of Care (Signed)
Discussed with Patient and POA Dtr Sindi.  Patient Requested DNR status.  Text to On-Call at this time of request.

## 2019-02-21 ENCOUNTER — Inpatient Hospital Stay (HOSPITAL_COMMUNITY): Payer: Medicare HMO | Admitting: Anesthesiology

## 2019-02-21 ENCOUNTER — Inpatient Hospital Stay (HOSPITAL_COMMUNITY): Payer: Medicare HMO

## 2019-02-21 ENCOUNTER — Encounter (HOSPITAL_COMMUNITY)
Admission: EM | Disposition: A | Discharge status: Skilled Nursing Facility | Payer: Self-pay | Source: Home / Self Care | Attending: Internal Medicine

## 2019-02-21 ENCOUNTER — Encounter (HOSPITAL_COMMUNITY): Payer: Self-pay | Admitting: *Deleted

## 2019-02-21 DIAGNOSIS — S72001A Fracture of unspecified part of neck of right femur, initial encounter for closed fracture: Principal | ICD-10-CM

## 2019-02-21 HISTORY — PX: HIP ARTHROPLASTY: SHX981

## 2019-02-21 LAB — CBC
HCT: 43.1 % (ref 39.0–52.0)
Hemoglobin: 14.4 g/dL (ref 13.0–17.0)
MCH: 34.1 pg — AB (ref 26.0–34.0)
MCHC: 33.4 g/dL (ref 30.0–36.0)
MCV: 102.1 fL — ABNORMAL HIGH (ref 80.0–100.0)
Platelets: 168 10*3/uL (ref 150–400)
RBC: 4.22 MIL/uL (ref 4.22–5.81)
RDW: 12.7 % (ref 11.5–15.5)
WBC: 12.7 10*3/uL — ABNORMAL HIGH (ref 4.0–10.5)
nRBC: 0 % (ref 0.0–0.2)

## 2019-02-21 LAB — VITAMIN D 25 HYDROXY (VIT D DEFICIENCY, FRACTURES): Vit D, 25-Hydroxy: 25.7 ng/mL — ABNORMAL LOW (ref 30.0–100.0)

## 2019-02-21 LAB — BASIC METABOLIC PANEL
Anion gap: 9 (ref 5–15)
BUN: 25 mg/dL — ABNORMAL HIGH (ref 8–23)
CO2: 26 mmol/L (ref 22–32)
CREATININE: 1.14 mg/dL (ref 0.61–1.24)
Calcium: 8.8 mg/dL — ABNORMAL LOW (ref 8.9–10.3)
Chloride: 102 mmol/L (ref 98–111)
GFR calc Af Amer: >60 mL/min (ref >60)
GFR calc non Af Amer: >60 mL/min (ref >60)
Glucose, Bld: 128 mg/dL — ABNORMAL HIGH (ref 70–99)
Potassium: 4 mmol/L (ref 3.5–5.1)
Sodium: 137 mmol/L (ref 135–145)

## 2019-02-21 LAB — TSH: TSH: 9.472 u[IU]/mL — ABNORMAL HIGH (ref 0.350–4.500)

## 2019-02-21 LAB — ABO/RH: ABO/RH(D): O POS

## 2019-02-21 LAB — MRSA PCR SCREENING: MRSA by PCR: NEGATIVE

## 2019-02-21 SURGERY — HEMIARTHROPLASTY, HIP, DIRECT ANTERIOR APPROACH, FOR FRACTURE
Anesthesia: Spinal | Site: Hip | Laterality: Right

## 2019-02-21 MED ORDER — PROPOFOL 500 MG/50ML IV EMUL
INTRAVENOUS | Status: DC | PRN
  Administered 2019-02-21: 40 mg via INTRAVENOUS

## 2019-02-21 MED ORDER — METOCLOPRAMIDE HCL 5 MG/ML IJ SOLN: 5.0000 mg | Freq: Three times a day (TID) | INTRAMUSCULAR | Status: DC | PRN

## 2019-02-21 MED ORDER — FENTANYL CITRATE (PF) 100 MCG/2ML IJ SOLN
INTRAMUSCULAR | Status: DC | PRN
  Administered 2019-02-21: 100 ug via INTRAVENOUS

## 2019-02-21 MED ORDER — SODIUM CHLORIDE 0.9 % IV SOLN
INTRAVENOUS | Status: DC | PRN
  Administered 2019-02-21: 30 ug/min via INTRAVENOUS

## 2019-02-21 MED ORDER — ONDANSETRON HCL 4 MG PO TABS: 4.0000 mg | ORAL_TABLET | Freq: Four times a day (QID) | ORAL | Status: DC | PRN

## 2019-02-21 MED ORDER — ACETAMINOPHEN 325 MG PO TABS
325.0000 mg | ORAL_TABLET | Freq: Four times a day (QID) | ORAL | Status: DC | PRN
  Administered 2019-02-21: 325 mg via ORAL
  Filled 2019-02-21: qty 1

## 2019-02-21 MED ORDER — PROPOFOL 500 MG/50ML IV EMUL
INTRAVENOUS | Status: DC | PRN
  Administered 2019-02-21: 50 ug/kg/min via INTRAVENOUS

## 2019-02-21 MED ORDER — METOCLOPRAMIDE HCL 5 MG PO TABS: 5.0000 mg | ORAL_TABLET | Freq: Three times a day (TID) | ORAL | Status: DC | PRN

## 2019-02-21 MED ORDER — ONDANSETRON HCL 4 MG/2ML IJ SOLN: 4.0000 mg | Freq: Four times a day (QID) | INTRAMUSCULAR | Status: DC | PRN

## 2019-02-21 MED ORDER — KETAMINE HCL 10 MG/ML IJ SOLN
INTRAMUSCULAR | Status: DC | PRN
  Administered 2019-02-21: 20 mg via INTRAVENOUS
  Administered 2019-02-21: 30 mg via INTRAVENOUS

## 2019-02-21 MED ORDER — HYDROCODONE-ACETAMINOPHEN 7.5-325 MG PO TABS
1.0000 | ORAL_TABLET | ORAL | Status: DC | PRN
  Administered 2019-02-21 – 2019-02-23 (×4): 1 via ORAL
  Filled 2019-02-21 (×4): qty 1

## 2019-02-21 MED ORDER — LEVOTHYROXINE SODIUM 75 MCG PO TABS
150.0000 ug | ORAL_TABLET | Freq: Every day | ORAL | Status: DC
  Administered 2019-02-22 – 2019-02-25 (×4): 150 ug via ORAL
  Filled 2019-02-21 (×4): qty 2

## 2019-02-21 MED ORDER — HYDROCODONE-ACETAMINOPHEN 5-325 MG PO TABS
1.0000 | ORAL_TABLET | ORAL | Status: DC | PRN
  Administered 2019-02-21 – 2019-02-25 (×4): 1 via ORAL
  Filled 2019-02-21 (×4): qty 1

## 2019-02-21 MED ORDER — NAPROXEN 250 MG PO TABS
250.0000 mg | ORAL_TABLET | Freq: Two times a day (BID) | ORAL | Status: DC
  Administered 2019-02-21 – 2019-02-25 (×7): 250 mg via ORAL
  Filled 2019-02-21 (×8): qty 1

## 2019-02-21 MED ORDER — HYDROCODONE-ACETAMINOPHEN 5-325 MG PO TABS: 1.0000 | ORAL_TABLET | Freq: Four times a day (QID) | ORAL | 0 refills | Status: AC | PRN

## 2019-02-21 MED ORDER — ALPRAZOLAM 0.25 MG PO TABS: 0.2500 mg | ORAL_TABLET | Freq: Once | ORAL | Status: DC

## 2019-02-21 MED ORDER — STERILE WATER FOR IRRIGATION IR SOLN
Status: DC | PRN
  Administered 2019-02-21: 1000 mL

## 2019-02-21 MED ORDER — PHENOL 1.4 % MT LIQD: 1.0000 | OROMUCOSAL | Status: DC | PRN

## 2019-02-21 MED ORDER — DONEPEZIL HCL 10 MG PO TABS
10.0000 mg | ORAL_TABLET | Freq: Every day | ORAL | Status: DC
  Administered 2019-02-22 – 2019-02-24 (×3): 10 mg via ORAL
  Filled 2019-02-21 (×3): qty 1

## 2019-02-21 MED ORDER — BUPIVACAINE HCL (PF) 0.75 % IJ SOLN
INTRAMUSCULAR | Status: DC | PRN
  Administered 2019-02-21: 2 mL via INTRATHECAL

## 2019-02-21 MED ORDER — PHENYLEPHRINE 40 MCG/ML (10ML) SYRINGE FOR IV PUSH (FOR BLOOD PRESSURE SUPPORT)
PREFILLED_SYRINGE | INTRAVENOUS | Status: AC
  Filled 2019-02-21: qty 30

## 2019-02-21 MED ORDER — EPHEDRINE SULFATE 50 MG/ML IJ SOLN
INTRAMUSCULAR | Status: DC | PRN
  Administered 2019-02-21: 10 mg via INTRAVENOUS

## 2019-02-21 MED ORDER — EPHEDRINE 5 MG/ML INJ
INTRAVENOUS | Status: AC
  Filled 2019-02-21: qty 10

## 2019-02-21 MED ORDER — KETAMINE HCL 10 MG/ML IJ SOLN
INTRAMUSCULAR | Status: AC
  Filled 2019-02-21: qty 1

## 2019-02-21 MED ORDER — LACTATED RINGERS IV SOLN
INTRAVENOUS | Status: DC | PRN
  Administered 2019-02-21 (×2): via INTRAVENOUS

## 2019-02-21 MED ORDER — MENTHOL 3 MG MT LOZG: 1.0000 | LOZENGE | OROMUCOSAL | Status: DC | PRN

## 2019-02-21 MED ORDER — ASPIRIN EC 325 MG PO TBEC
325.0000 mg | DELAYED_RELEASE_TABLET | Freq: Every day | ORAL | Status: DC
  Administered 2019-02-22 – 2019-02-25 (×4): 325 mg via ORAL
  Filled 2019-02-21 (×4): qty 1

## 2019-02-21 MED ORDER — FLUOXETINE HCL 20 MG PO CAPS
40.0000 mg | ORAL_CAPSULE | Freq: Every day | ORAL | Status: DC
  Administered 2019-02-22 – 2019-02-25 (×4): 40 mg via ORAL
  Filled 2019-02-21 (×4): qty 2

## 2019-02-21 MED ORDER — MORPHINE SULFATE (PF) 2 MG/ML IV SOLN: 0.5000 mg | INTRAVENOUS | Status: DC | PRN

## 2019-02-21 MED ORDER — CEFAZOLIN SODIUM-DEXTROSE 2-4 GM/100ML-% IV SOLN
INTRAVENOUS | Status: AC
  Filled 2019-02-21: qty 100

## 2019-02-21 MED ORDER — SODIUM CHLORIDE 0.45 % IV SOLN
INTRAVENOUS | Status: DC
  Administered 2019-02-21: 13:00:00 via INTRAVENOUS

## 2019-02-21 MED ORDER — PROMETHAZINE HCL 25 MG/ML IJ SOLN: 6.2500 mg | INTRAMUSCULAR | Status: DC | PRN

## 2019-02-21 MED ORDER — OXYCODONE HCL 5 MG/5ML PO SOLN: 5.0000 mg | Freq: Once | ORAL | Status: DC | PRN

## 2019-02-21 MED ORDER — PHENYLEPHRINE HCL 10 MG/ML IJ SOLN
INTRAMUSCULAR | Status: DC | PRN
  Administered 2019-02-21 (×2): 120 ug via INTRAVENOUS
  Administered 2019-02-21: 80 ug via INTRAVENOUS
  Administered 2019-02-21: 160 ug via INTRAVENOUS
  Administered 2019-02-21: 80 ug via INTRAVENOUS
  Administered 2019-02-21: 40 ug via INTRAVENOUS

## 2019-02-21 MED ORDER — POLYETHYLENE GLYCOL 3350 17 G PO PACK: 17.0000 g | PACK | Freq: Every day | ORAL | Status: DC | PRN

## 2019-02-21 MED ORDER — CITALOPRAM HYDROBROMIDE 20 MG PO TABS: 20.0000 mg | ORAL_TABLET | Freq: Every day | ORAL | Status: DC

## 2019-02-21 MED ORDER — PROPOFOL 10 MG/ML IV BOLUS
INTRAVENOUS | Status: AC
  Filled 2019-02-21: qty 60

## 2019-02-21 MED ORDER — 0.9 % SODIUM CHLORIDE (POUR BTL) OPTIME
TOPICAL | Status: DC | PRN
  Administered 2019-02-21: 1000 mL

## 2019-02-21 MED ORDER — BUPIVACAINE HCL (PF) 0.5 % IJ SOLN
INTRAMUSCULAR | Status: AC
  Filled 2019-02-21: qty 30

## 2019-02-21 MED ORDER — FENTANYL CITRATE (PF) 100 MCG/2ML IJ SOLN
INTRAMUSCULAR | Status: AC
  Filled 2019-02-21: qty 2

## 2019-02-21 MED ORDER — ONDANSETRON HCL 4 MG/2ML IJ SOLN
INTRAMUSCULAR | Status: DC | PRN
  Administered 2019-02-21: 4 mg via INTRAVENOUS

## 2019-02-21 MED ORDER — OXYCODONE HCL 5 MG PO TABS: 5.0000 mg | ORAL_TABLET | Freq: Once | ORAL | Status: DC | PRN

## 2019-02-21 MED ORDER — HYDROMORPHONE HCL 1 MG/ML IJ SOLN: 0.2500 mg | INTRAMUSCULAR | Status: DC | PRN

## 2019-02-21 MED ORDER — DOCUSATE SODIUM 100 MG PO CAPS
100.0000 mg | ORAL_CAPSULE | Freq: Two times a day (BID) | ORAL | Status: DC
  Administered 2019-02-21 – 2019-02-23 (×5): 100 mg via ORAL
  Filled 2019-02-21 (×6): qty 1

## 2019-02-21 SURGICAL SUPPLY — 59 items
BLADE SAW SAG 73X25 THK (BLADE) ×1
BLADE SAW SGTL 73X25 THK (BLADE) ×2 IMPLANT
BRUSH FEMORAL CANAL (MISCELLANEOUS) IMPLANT
CHLORAPREP W/TINT 26 (MISCELLANEOUS) ×3 IMPLANT
COVER SURGICAL LIGHT HANDLE (MISCELLANEOUS) ×3 IMPLANT
COVER WAND RF STERILE (DRAPES) IMPLANT
DRAPE INCISE IOBAN 66X45 STRL (DRAPES) ×3 IMPLANT
DRAPE ORTHO SPLIT 77X108 STRL (DRAPES) ×4
DRAPE POUCH INSTRU U-SHP 10X18 (DRAPES) ×3 IMPLANT
DRAPE SURG ORHT 6 SPLT 77X108 (DRAPES) ×2 IMPLANT
DRAPE U-SHAPE 47X51 STRL (DRAPES) ×3 IMPLANT
DRAPE WARM FLUID 44X44 (DRAPE) ×3 IMPLANT
DRSG PAD ABDOMINAL 8X10 ST (GAUZE/BANDAGES/DRESSINGS) ×2 IMPLANT
ELECT BLADE TIP CTD 4 INCH (ELECTRODE) ×3 IMPLANT
ELECT REM PT RETURN 15FT ADLT (MISCELLANEOUS) ×3 IMPLANT
EVACUATOR 1/8 PVC DRAIN (DRAIN) ×3 IMPLANT
FACESHIELD WRAPAROUND (MASK) ×15 IMPLANT
FACESHIELD WRAPAROUND OR TEAM (MASK) ×5 IMPLANT
GAUZE SPONGE 4X4 12PLY STRL (GAUZE/BANDAGES/DRESSINGS) ×4 IMPLANT
GAUZE XEROFORM 1X8 LF (GAUZE/BANDAGES/DRESSINGS) ×2 IMPLANT
GAUZE XEROFORM 5X9 LF (GAUZE/BANDAGES/DRESSINGS) ×3 IMPLANT
GLOVE BIOGEL PI IND STRL 8 (GLOVE) ×1 IMPLANT
GLOVE BIOGEL PI INDICATOR 8 (GLOVE) ×2
GLOVE ORTHO TXT STRL SZ7.5 (GLOVE) ×3 IMPLANT
GOWN STRL REUS W/TWL LRG LVL3 (GOWN DISPOSABLE) ×3 IMPLANT
HANDPIECE INTERPULSE COAX TIP (DISPOSABLE)
HEAD FEM UNIPOLAR 52 OD STRL (Hips) ×2 IMPLANT
IMMOBILIZER KNEE 20 (SOFTGOODS) ×3 IMPLANT
IMMOBILIZER KNEE 20 THIGH 36 (SOFTGOODS) ×1 IMPLANT
KIT BASIN OR (CUSTOM PROCEDURE TRAY) ×3 IMPLANT
KIT TURNOVER KIT A (KITS) IMPLANT
NDL MAYO CATGUT SZ4 TPR NDL (NEEDLE) ×1 IMPLANT
NEEDLE HYPO 22GX1.5 SAFETY (NEEDLE) IMPLANT
NEEDLE MAYO CATGUT SZ4 (NEEDLE) ×3 IMPLANT
NS IRRIG 1000ML POUR BTL (IV SOLUTION) ×6 IMPLANT
PACK TOTAL JOINT (CUSTOM PROCEDURE TRAY) ×3 IMPLANT
PASSER SUT SWANSON 36MM LOOP (INSTRUMENTS) ×3 IMPLANT
PROTECTOR NERVE ULNAR (MISCELLANEOUS) ×3 IMPLANT
SET HNDPC FAN SPRY TIP SCT (DISPOSABLE) IMPLANT
SPACER FEM TAPERED +0 12/14 (Hips) ×2 IMPLANT
SPONGE LAP 18X18 RF (DISPOSABLE) IMPLANT
STAPLER VISISTAT 35W (STAPLE) ×3 IMPLANT
STEM SUMMIT BASIC PRESSFIT SZ4 (Hips) ×2 IMPLANT
SUCTION FRAZIER HANDLE 12FR (TUBING) ×2
SUCTION TUBE FRAZIER 12FR DISP (TUBING) ×1 IMPLANT
SUT ETHIBOND NAB CT1 #1 30IN (SUTURE) ×12 IMPLANT
SUT VIC AB 0 CT1 27 (SUTURE) ×2
SUT VIC AB 0 CT1 27XBRD ANTBC (SUTURE) ×1 IMPLANT
SUT VIC AB 1 CT1 27 (SUTURE)
SUT VIC AB 1 CT1 27XBRD ANTBC (SUTURE) IMPLANT
SUT VIC AB 1 CTX 36 (SUTURE)
SUT VIC AB 1 CTX36XBRD ANBCTR (SUTURE) IMPLANT
SUT VIC AB 2-0 CT1 36 (SUTURE) ×9 IMPLANT
SYR 30ML LL (SYRINGE) IMPLANT
TAPE CLOTH SURG 6X10 WHT LF (GAUZE/BANDAGES/DRESSINGS) ×2 IMPLANT
TOWEL OR 17X26 10 PK STRL BLUE (TOWEL DISPOSABLE) ×6 IMPLANT
TOWER CARTRIDGE SMART MIX (DISPOSABLE) IMPLANT
TRAY FOLEY MTR SLVR 16FR STAT (SET/KITS/TRAYS/PACK) ×3 IMPLANT
WATER STERILE IRR 1000ML POUR (IV SOLUTION) ×3 IMPLANT

## 2019-02-21 NOTE — H&P (View-Only) (Signed)
 Reason for Consult: Right closed displaced femoral neck fracture Referring Physician: Chiu MD  Michael Weaver is an 78 y.o. male.  HPI: 78-year-old male who is a resident of independent living at [facility] off [address] reportedly slipped out of a chair with a mechanical fall no loss of consciousness with immediate right hip pain inability to ambulate.  He was brought to the ER by EMS where x-rays revealed displaced femoral neck fracture.  Past Medical History:  Diagnosis Date  . Alcohol abuse, unspecified 11/11/2013  . Dementia (HCC)   . Hypertension   . Thyroid disease     Past Surgical History:  Procedure Laterality Date  . DENTAL SURGERY    . EYE SURGERY    . FRACTURE SURGERY     knee  . HAND SURGERY      Family History  Problem Relation Age of Onset  . Breast cancer Sister     Social History:  reports that he has quit smoking. His smoking use included cigarettes. He has a 30.00 pack-year smoking history. He has never used smokeless tobacco. He reports current alcohol use. He reports that he does not use drugs.  Allergies: No Known Allergies  Medications: I have reviewed the patient's current medications.  Results for orders placed or performed during the hospital encounter of 02/20/19 (from the past 48 hour(s))  Basic metabolic panel     Status: Abnormal   Collection Time: 02/20/19  4:03 PM  Result Value Ref Range   Sodium 141 135 - 145 mmol/L   Potassium 4.3 3.5 - 5.1 mmol/L   Chloride 105 98 - 111 mmol/L   CO2 24 22 - 32 mmol/L   Glucose, Bld 167 (H) 70 - 99 mg/dL   BUN 25 (H) 8 - 23 mg/dL   Creatinine, Ser 1.28 (H) 0.61 - 1.24 mg/dL   Calcium 9.2 8.9 - 10.3 mg/dL   GFR calc non Af Amer 53 (L) >60 mL/min   GFR calc Af Amer >60 >60 mL/min   Anion gap 12 5 - 15    Comment: Performed at [facility], [address] [place], [place] [address]  CBC with Differential     Status: Abnormal   Collection Time: 02/20/19  4:03 PM  Result Value  Ref Range   WBC 17.0 (H) 4.0 - 10.5 K/uL   RBC 4.55 4.22 - 5.81 MIL/uL   Hemoglobin 15.2 13.0 - 17.0 g/dL   HCT 45.9 39.0 - 52.0 %   MCV 100.9 (H) 80.0 - 100.0 fL   MCH 33.4 26.0 - 34.0 pg   MCHC 33.1 30.0 - 36.0 g/dL   RDW 12.5 11.5 - 15.5 %   Platelets 180 150 - 400 K/uL   nRBC 0.0 0.0 - 0.2 %   Neutrophils Relative % 92 %   Neutro Abs 15.6 (H) 1.7 - 7.7 K/uL   Lymphocytes Relative 3 %   Lymphs Abs 0.5 (L) 0.7 - 4.0 K/uL   Monocytes Relative 4 %   Monocytes Absolute 0.7 0.1 - 1.0 K/uL   Eosinophils Relative 0 %   Eosinophils Absolute 0.0 0.0 - 0.5 K/uL   Basophils Relative 0 %   Basophils Absolute 0.1 0.0 - 0.1 K/uL   Immature Granulocytes 1 %   Abs Immature Granulocytes 0.11 (H) 0.00 - 0.07 K/uL    Comment: Performed at [facility], [address] [place], [place] [address]  Protime-INR     Status: None   Collection Time:   02/20/19  4:51 PM  Result Value Ref Range   Prothrombin Time 12.4 11.4 - 15.2 seconds   INR 0.9 0.8 - 1.2    Comment: (NOTE) INR goal varies based on device and disease states. Performed at [facility], [address] [place], [place] [address]   Type and screen [facility]     Status: None   Collection Time: 02/20/19  4:51 PM  Result Value Ref Range   ABO/RH(D) O POS    Antibody Screen NEG    Sample Expiration      02/23/2019 Performed at [facility], [address] [place], [place] [address]   ABO/Rh     Status: None (Preliminary result)   Collection Time: 02/20/19  6:39 PM  Result Value Ref Range   ABO/RH(D)      O POS Performed at [facility], [address] [place], [place] [address]   Albumin     Status: None   Collection Time: 02/20/19  8:26 PM  Result Value Ref Range   Albumin 4.3 3.5 - 5.0 g/dL    Comment: Performed at [facility], [address] [place], [place] [address]  VITAMIN D 25 Hydroxy (Vit-D  Deficiency, Fractures)     Status: Abnormal   Collection Time: 02/20/19  8:26 PM  Result Value Ref Range   Vit D, 25-Hydroxy 25.7 (L) 30.0 - 100.0 ng/mL    Comment: (NOTE) Vitamin D deficiency has been defined by the Institute of Medicine and an Endocrine Society practice guideline as a level of serum 25-OH vitamin D less than 20 ng/mL (1,2). The Endocrine Society went on to further define vitamin D insufficiency as a level between 21 and 29 ng/mL (2). 1. IOM (Institute of Medicine). 2010. Dietary reference   intakes for calcium and D. [place]: The   National Academies Press. 2. Holick MF, Binkley Sheridan, Bischoff-Ferrari HA, et al.   Evaluation, treatment, and prevention of vitamin D   deficiency: an Endocrine Society clinical practice   guideline. JCEM. 2011 Jul; 96(7):1911-30. Performed At: [facility] [address] [place], [place] [id number] Nagendra Sanjai MD Ph:[phone]   MRSA PCR Screening     Status: None   Collection Time: 02/20/19 10:56 PM  Result Value Ref Range   MRSA by PCR NEGATIVE NEGATIVE    Comment:        The GeneXpert MRSA Assay (FDA approved for NASAL specimens only), is one component of a comprehensive MRSA colonization surveillance program. It is not intended to diagnose MRSA infection nor to guide or monitor treatment for MRSA infections. Performed at [facility], [address] [place], [place] [address]   TSH     Status: Abnormal   Collection Time: 02/21/19  4:52 AM  Result Value Ref Range   TSH 9.472 (H) 0.350 - 4.500 uIU/mL    Comment: Performed by a 3rd Generation assay with a functional sensitivity of <=0.01 uIU/mL. Performed at [facility], [address] [place], [place] [address]   CBC     Status: Abnormal   Collection Time: 02/21/19  4:52 AM  Result Value Ref Range   WBC 12.7 (H) 4.0 - 10.5 K/uL   RBC 4.22 4.22 - 5.81 MIL/uL   Hemoglobin 14.4 13.0 - 17.0 g/dL   HCT 43.1 39.0 -  52.0 %   MCV 102.1 (H) 80.0 - 100.0 fL   MCH 34.1 (H) 26.0 - 34.0 pg     MCHC 33.4 30.0 - 36.0 g/dL   RDW 12.7 11.5 - 15.5 %   Platelets 168 150 - 400 K/uL   nRBC 0.0 0.0 - 0.2 %    Comment: Performed at [facility], [address] [place], [place] [address]  Basic metabolic panel     Status: Abnormal   Collection Time: 02/21/19  4:52 AM  Result Value Ref Range   Sodium 137 135 - 145 mmol/L   Potassium 4.0 3.5 - 5.1 mmol/L   Chloride 102 98 - 111 mmol/L   CO2 26 22 - 32 mmol/L   Glucose, Bld 128 (H) 70 - 99 mg/dL   BUN 25 (H) 8 - 23 mg/dL   Creatinine, Ser 1.14 0.61 - 1.24 mg/dL   Calcium 8.8 (L) 8.9 - 10.3 mg/dL   GFR calc non Af Amer >60 >60 mL/min   GFR calc Af Amer >60 >60 mL/min   Anion gap 9 5 - 15    Comment: Performed at [facility], [address] [place], [place] [address]    Dg Chest 2 View  Result Date: 02/20/2019 CLINICAL DATA:  78-year-old male status post mechanical fall with right hip pain EXAM: CHEST - 2 VIEW COMPARISON:  Concurrently obtained radiographs of the right hip FINDINGS: The patient is rotated toward the right which slightly distorts the cardiac and mediastinal contours. Within these limitations, the cardiac and mediastinal contours are likely within normal limits. Inspiratory volumes are low. There is mild pulmonary vascular congestion but no overt edema. No pleural effusion or pneumothorax. No evidence of acute fracture. IMPRESSION: 1. Low lung volumes with mild pulmonary vascular congestion but no interstitial edema. Electronically Signed   By: Heath  McCullough M.D.   On: 02/20/2019 16:33   Dg Hip Unilat W Or Wo Pelvis 2-3 Views Right  Result Date: 02/20/2019 CLINICAL DATA:  Right hip pain after fall. EXAM: DG HIP (WITH OR WITHOUT PELVIS) 2-3V RIGHT COMPARISON:  None. FINDINGS: Moderately displaced fracture is seen involving the proximal right femoral neck. Left hip is unremarkable. IMPRESSION: Moderately displaced  proximal right femoral neck fracture. Electronically Signed   By: James  Green Jr, M.D.   On: 02/20/2019 16:32    ROS patient has history of dementia is somewhat poor historian.,  Chart review is used for review of systems.  Positive for alcohol abuse hypotension dehydration history of essential hypertension acute renal failure.  Negative for heart attack or stroke.  I talked with patient's daughter who relates he has had previous laceration of the finger with microsurgical repair.  Also previous knee surgery otherwise negative surgical history. Blood pressure 108/69, pulse 77, temperature 98.5 F (36.9 C), resp. rate 16, height 6' (1.829 m), weight 62.9 kg, SpO2 98 %. Physical Exam  Constitutional:  Thin elderly patient in bed poor historian.  Responsive to questions oriented to person place.  HENT:  Head: Normocephalic and atraumatic.  Eyes: Pupils are equal, round, and reactive to light. Conjunctivae are normal.  Neck: Normal range of motion. No tracheal deviation present. No thyromegaly present.  Cardiovascular: Normal rate.  Respiratory: Effort normal. No respiratory distress. He has no wheezes.  GI: Soft.  Musculoskeletal:     Comments: Right lower extremity shortened externally rotated pulses are normal.  Pain with right hip range of motion negative left hip range of motion.  Neurological:  Alert responsive oriented x2.  Psychiatric:  Pleasant cooperative and conversant.  Poor historian.    Assessment/Plan: Patient with displaced right femoral neck   fracture.  Plan right hip hemiarthroplasty with ambulation beginning tomorrow.  Patient has no pre-existing hip osteoarthritis.  Patient likely will require SNF before he returns assisted living.  Patient is at some increased risk due to his dementia.  Renal function shows creatinine normal at 1.14 with BUN of 25.  Procedure discussed with patient.  Questions were elicited and answered.  I called his daughter left a phone message will  recall her again.  Michael Weaver 02/21/2019, 8:18 AM   Addendum reach daughter by phone and discussed outlined plan for treatment.  Questions elicited and answered she agrees to proceed.   

## 2019-02-21 NOTE — Anesthesia Preprocedure Evaluation (Signed)
Anesthesia Evaluation  Patient identified by MRN, date of birth, ID band Patient awake    Reviewed: Allergy & Precautions, NPO status , Patient's Chart, lab work & pertinent test results  Airway Mallampati: II  TM Distance: >3 FB Neck ROM: Full    Dental no notable dental hx.    Pulmonary neg pulmonary ROS, former smoker,    Pulmonary exam normal breath sounds clear to auscultation       Cardiovascular hypertension, Pt. on medications negative cardio ROS Normal cardiovascular exam Rhythm:Regular Rate:Normal     Neuro/Psych Dementia negative neurological ROS  negative psych ROS   GI/Hepatic negative GI ROS, Neg liver ROS,   Endo/Other  Hypothyroidism   Renal/GU negative Renal ROS  negative genitourinary   Musculoskeletal negative musculoskeletal ROS (+)   Abdominal   Peds negative pediatric ROS (+)  Hematology negative hematology ROS (+)   Anesthesia Other Findings   Reproductive/Obstetrics negative OB ROS                             Anesthesia Physical Anesthesia Plan  ASA: III  Anesthesia Plan: Spinal   Post-op Pain Management:    Induction: Intravenous  PONV Risk Score and Plan: 1 and Ondansetron  Airway Management Planned: Simple Face Mask  Additional Equipment:   Intra-op Plan:   Post-operative Plan:   Informed Consent: I have reviewed the patients History and Physical, chart, labs and discussed the procedure including the risks, benefits and alternatives for the proposed anesthesia with the patient or authorized representative who has indicated his/her understanding and acceptance.     Dental advisory given  Plan Discussed with: CRNA  Anesthesia Plan Comments:         Anesthesia Quick Evaluation

## 2019-02-21 NOTE — Interval H&P Note (Signed)
History and Physical Interval Note:  02/21/2019 9:59 AM  Michael Weaver  has presented today for surgery, with the diagnosis of right femoral neck fracture.  The various methods of treatment have been discussed with the patient and family. After consideration of risks, benefits and other options for treatment, the patient has consented to  Procedure(s): RIGHT ARTHROPLASTY UNIPOLAR HIP (HEMIARTHROPLASTY) (Right) as a surgical intervention.  The patient's history has been reviewed, patient examined, no change in status, stable for surgery.  I have reviewed the patient's chart and labs.  Questions were answered to the patient's satisfaction.     Eldred Manges

## 2019-02-21 NOTE — Anesthesia Postprocedure Evaluation (Signed)
Anesthesia Post Note  Patient: Michael Weaver  Procedure(s) Performed: RIGHT ARTHROPLASTY UNIPOLAR HIP (HEMIARTHROPLASTY) (Right Hip)     Patient location during evaluation: PACU Anesthesia Type: Spinal Level of consciousness: oriented and awake and alert Pain management: pain level controlled Vital Signs Assessment: post-procedure vital signs reviewed and stable Respiratory status: spontaneous breathing and respiratory function stable Cardiovascular status: blood pressure returned to baseline and stable Postop Assessment: no headache, no backache and no apparent nausea or vomiting Anesthetic complications: no    Last Vitals:  Vitals:   02/21/19 1316 02/21/19 1316  BP: (!) 91/59 (!) 91/59  Pulse: 80 81  Resp: 16 16  Temp: 36.7 C 36.7 C  SpO2: 100% 100%    Last Pain:  Vitals:   02/21/19 1327  TempSrc:   PainSc: 3                  Lowella Curb

## 2019-02-21 NOTE — Progress Notes (Signed)
PROGRESS NOTE    Michael Weaver  NTZ:[id number] DOB: [birth year] DOA: 02/20/2019 PCP: Alois Cliche, PA-C    Brief Narrative:  78 y.o. male with medical history significant of hypertension and dementia hypothyroidism    Presented with   Mechanical fall as he slid out of the chair. Fell back wards no head injury.  Noted to have right away right hip pain with deformity and shortening EMS was called patient was given 100 mcg of fentanyl in route heart rate 70 blood pressure 124/70 satting 99% room air  Able to walk well with out any chest pain or shortness of breath no prior hx of CAD or lung disease   Assessment & Plan:   Principal Problem:   Closed displaced fracture of right femoral neck (HCC) Active Problems:   AKI (acute kidney injury) (HCC)   Leukocytosis   Essential hypertension   Hypothyroidism   Right femoral fracture (HCC)  Present on Admission:  Closed displaced fracture of right femoral neck (HCC) -  -  -Orthopedic Surgery consulted and is following. Pt now s/p surgery 3/14 -PT/OT eval   AKI (acute kidney injury) (HCC)  -given gentle IVF hydration -Repeat renal panel reviewed, improved -Repeat bmet in AM   Leukocytosis  -in the setting of recent fall and fracture otherwise no underlying infectious source noted -Repeat WBC improved -Repeat cbc in AM   Essential hypertension  -restart lisinopril once creatinine improves back to baseline -BP stable at this time   Hypothyroidism  - TSH 9.47 - Currently on synthroid  DVT prophylaxis: SCD's Code Status: DNR Family Communication: Pt in room, family not at bedside Disposition Plan: Uncertain at this time  Consultants:   Orthopedic Surgery  Procedures:   Hip surgery 3/14  Antimicrobials: Anti-infectives (From admission, onward)   Start     Dose/Rate Route Frequency Ordered Stop   02/21/19 1018  ceFAZolin (ANCEF) 2-4 GM/100ML-% IVPB    Note to Pharmacy:  Mirian Mo   : cabinet  override      02/21/19 1018 02/21/19 1043   02/21/19 0600  ceFAZolin (ANCEF) IVPB 2g/100 mL premix     2 g 200 mL/hr over 30 Minutes Intravenous On call to O.R. 02/20/19 2237 02/21/19 1113       Subjective: Without complaints at this time. Reports post-op pain controlled  Objective: Vitals:   02/21/19 1256 02/21/19 1316 02/21/19 1316 02/21/19 1418  BP: (!) 104/59 (!) 91/59 (!) 91/59 107/62  Pulse:  80 81 80  Resp: 16 16 16 16   Temp: 98 F (36.7 C) 98 F (36.7 C) 98 F (36.7 C) (!) 97.4 F (36.3 C)  TempSrc:  Oral Oral   SpO2:  100% 100% 100%  Weight:      Height:        Intake/Output Summary (Last 24 hours) at 02/21/2019 1531 Last data filed at 02/21/2019 1400 Gross per 24 hour  Intake 2356.82 ml  Output 705 ml  Net 1651.82 ml   Filed Weights   02/20/19 2047  Weight: 62.9 kg    Examination:  General exam: Appears calm and comfortable  Respiratory system: Clear to auscultation. Respiratory effort normal. Cardiovascular system: S1 & S2 heard, RRR Gastrointestinal system: Abdomen is nondistended, soft and nontender. No organomegaly or masses felt. Normal bowel sounds heard. Central nervous system: Alert and oriented. No focal neurological deficits. Extremities: Symmetric 5 x 5 power. Skin: No rashes, lesions  Psychiatry: Judgement and insight appear normal. Mood & affect appropriate.   Data Reviewed:  I have personally reviewed following labs and imaging studies  CBC: Recent Labs  Lab 02/20/19 1603 02/21/19 0452  WBC 17.0* 12.7*  NEUTROABS 15.6*  --   HGB 15.2 14.4  HCT 45.9 43.1  MCV 100.9* 102.1*  PLT 180 168   Basic Metabolic Panel: Recent Labs  Lab 02/20/19 1603 02/21/19 0452  NA 141 137  K 4.3 4.0  CL 105 102  CO2 24 26  GLUCOSE 167* 128*  BUN 25* 25*  CREATININE 1.28* 1.14  CALCIUM 9.2 8.8*   GFR: Estimated Creatinine Clearance: 46.7 mL/min (by C-G formula based on SCr of 1.14 mg/dL). Liver Function Tests: Recent Labs  Lab  02/20/19 2026  ALBUMIN 4.3   No results for input(s): LIPASE, AMYLASE in the last 168 hours. No results for input(s): AMMONIA in the last 168 hours. Coagulation Profile: Recent Labs  Lab 02/20/19 1651  INR 0.9   Cardiac Enzymes: No results for input(s): CKTOTAL, CKMB, CKMBINDEX, TROPONINI in the last 168 hours. BNP (last 3 results) No results for input(s): PROBNP in the last 8760 hours. HbA1C: No results for input(s): HGBA1C in the last 72 hours. CBG: No results for input(s): GLUCAP in the last 168 hours. Lipid Profile: No results for input(s): CHOL, HDL, LDLCALC, TRIG, CHOLHDL, LDLDIRECT in the last 72 hours. Thyroid Function Tests: Recent Labs    02/21/19 0452  TSH 9.472*   Anemia Panel: No results for input(s): VITAMINB12, FOLATE, FERRITIN, TIBC, IRON, RETICCTPCT in the last 72 hours. Sepsis Labs: No results for input(s): PROCALCITON, LATICACIDVEN in the last 168 hours.  Recent Results (from the past 240 hour(s))  MRSA PCR Screening     Status: None   Collection Time: 02/20/19 10:56 PM  Result Value Ref Range Status   MRSA by PCR NEGATIVE NEGATIVE Final    Comment:        The GeneXpert MRSA Assay (FDA approved for NASAL specimens only), is one component of a comprehensive MRSA colonization surveillance program. It is not intended to diagnose MRSA infection nor to guide or monitor treatment for MRSA infections. Performed at [facility], [address] [place], [place] [address]      Radiology Studies: Dg Chest 2 View  Result Date: 02/20/2019 CLINICAL DATA:  78 year old male status post mechanical fall with right hip pain EXAM: CHEST - 2 VIEW COMPARISON:  Concurrently obtained radiographs of the right hip FINDINGS: The patient is rotated toward the right which slightly distorts the cardiac and mediastinal contours. Within these limitations, the cardiac and mediastinal contours are likely within normal limits. Inspiratory volumes are low.  There is mild pulmonary vascular congestion but no overt edema. No pleural effusion or pneumothorax. No evidence of acute fracture. IMPRESSION: 1. Low lung volumes with mild pulmonary vascular congestion but no interstitial edema. Electronically Signed   By: Malachy Moan M.D.   On: 02/20/2019 16:33   Pelvis Portable  Result Date: 02/21/2019 CLINICAL DATA:  Right hip replacement. EXAM: PORTABLE PELVIS 1-2 VIEWS COMPARISON:  None. FINDINGS: The patient is status post right hip replacement. Acetabular and femoral components are in good position. IMPRESSION: Right hip replacement as above. Electronically Signed   By: Gerome Sam III M.D   On: 02/21/2019 13:35   Dg Hip Unilat W Or Wo Pelvis 2-3 Views Right  Result Date: 02/20/2019 CLINICAL DATA:  Right hip pain after fall. EXAM: DG HIP (WITH OR WITHOUT PELVIS) 2-3V RIGHT COMPARISON:  None. FINDINGS: Moderately displaced fracture is seen involving the proximal right femoral  neck. Left hip is unremarkable. IMPRESSION: Moderately displaced proximal right femoral neck fracture. Electronically Signed   By: Lupita Raider, M.D.   On: 02/20/2019 16:32    Scheduled Meds:  [START ON 02/22/2019] aspirin EC  325 mg Oral Q breakfast   docusate sodium  100 mg Oral BID   [START ON 02/22/2019] donepezil  10 mg Oral QHS   [START ON 02/22/2019] FLUoxetine  40 mg Oral Daily   [START ON 02/22/2019] levothyroxine  150 mcg Oral Q0600   naproxen  250 mg Oral BID WC   Continuous Infusions:  sodium chloride 50 mL/hr at 02/21/19 1324     LOS: 1 day   Rickey Barbara, MD [facility] Pager On Amion  If 7PM-7AM, please contact night-coverage 02/21/2019, 3:31 PM

## 2019-02-21 NOTE — Transfer of Care (Signed)
Immediate Anesthesia Transfer of Care Note  Patient: Michael Weaver  Procedure(s) Performed: RIGHT ARTHROPLASTY UNIPOLAR HIP (HEMIARTHROPLASTY) (Right Hip)  Patient Location: PACU  Anesthesia Type:Spinal  Level of Consciousness: sedated, patient cooperative and responds to stimulation  Airway & Oxygen Therapy: Patient Spontanous Breathing and Patient connected to face mask oxygen  Post-op Assessment: Report given to RN and Post -op Vital signs reviewed and stable  Post vital signs: Reviewed and stable  Last Vitals:  Vitals Value Taken Time  BP    Temp    Pulse    Resp    SpO2      Last Pain:  Vitals:   02/21/19 0740  TempSrc:   PainSc: 0-No pain      Patients Stated Pain Goal: 3 (02/20/19 2047)  Complications: No apparent anesthesia complications

## 2019-02-21 NOTE — Consult Note (Addendum)
Reason for Consult: Right closed displaced femoral neck fracture Referring Physician: Rhona Leavens MD  Michael Weaver is an 78 y.o. male.  HPI: 78 year old male who is a resident of independent living at [facility] [address] reportedly slipped out of a chair with a mechanical fall no loss of consciousness with immediate right hip pain inability to ambulate.  He was brought to the ER by EMS where x-rays revealed displaced femoral neck fracture.  Past Medical History:  Diagnosis Date  . Alcohol abuse, unspecified 11/11/2013  . Dementia (HCC)   . Hypertension   . Thyroid disease     Past Surgical History:  Procedure Laterality Date  . DENTAL SURGERY    . EYE SURGERY    . FRACTURE SURGERY     knee  . HAND SURGERY      Family History  Problem Relation Age of Onset  . Breast cancer Sister     Social History:  reports that he has quit smoking. His smoking use included cigarettes. He has a 30.00 pack-year smoking history. He has never used smokeless tobacco. He reports current alcohol use. He reports that he does not use drugs.  Allergies: No Known Allergies  Medications: I have reviewed the patient's current medications.  Results for orders placed or performed during the hospital encounter of 02/20/19 (from the past 48 hour(s))  Basic metabolic panel     Status: Abnormal   Collection Time: 02/20/19  4:03 PM  Result Value Ref Range   Sodium 141 135 - 145 mmol/L   Potassium 4.3 3.5 - 5.1 mmol/L   Chloride 105 98 - 111 mmol/L   CO2 24 22 - 32 mmol/L   Glucose, Bld 167 (H) 70 - 99 mg/dL   BUN 25 (H) 8 - 23 mg/dL   Creatinine, Ser 1.61 (H) 0.61 - 1.24 mg/dL   Calcium 9.2 8.9 - 09.6 mg/dL   GFR calc non Af Amer 53 (L) >60 mL/min   GFR calc Af Amer >60 >60 mL/min   Anion gap 12 5 - 15    Comment: Performed at [facility], [address], [place], [place] [address]  CBC with Differential     Status: Abnormal   Collection Time: 02/20/19  4:03 PM  Result Value  Ref Range   WBC 17.0 (H) 4.0 - 10.5 K/uL   RBC 4.55 4.22 - 5.81 MIL/uL   Hemoglobin 15.2 13.0 - 17.0 g/dL   HCT 98.1 19.1 - 47.8 %   MCV 100.9 (H) 80.0 - 100.0 fL   MCH 33.4 26.0 - 34.0 pg   MCHC 33.1 30.0 - 36.0 g/dL   RDW 29.5 62.1 - 30.8 %   Platelets 180 150 - 400 K/uL   nRBC 0.0 0.0 - 0.2 %   Neutrophils Relative % 92 %   Neutro Abs 15.6 (H) 1.7 - 7.7 K/uL   Lymphocytes Relative 3 %   Lymphs Abs 0.5 (L) 0.7 - 4.0 K/uL   Monocytes Relative 4 %   Monocytes Absolute 0.7 0.1 - 1.0 K/uL   Eosinophils Relative 0 %   Eosinophils Absolute 0.0 0.0 - 0.5 K/uL   Basophils Relative 0 %   Basophils Absolute 0.1 0.0 - 0.1 K/uL   Immature Granulocytes 1 %   Abs Immature Granulocytes 0.11 (H) 0.00 - 0.07 K/uL    Comment: Performed at [facility], [address], [place], [place] [address]  Protime-INR     Status: None   Collection Time:  02/20/19  4:51 PM  Result Value Ref Range   Prothrombin Time 12.4 11.4 - 15.2 seconds   INR 0.9 0.8 - 1.2    Comment: (NOTE) INR goal varies based on device and disease states. Performed at [facility], [address], [place], [place] [address]   Type and screen [facility]     Status: None   Collection Time: 02/20/19  4:51 PM  Result Value Ref Range   ABO/RH(D) O POS    Antibody Screen NEG    Sample Expiration      02/23/2019 Performed at [facility], [address], [place], [place] [address]   ABO/Rh     Status: None (Preliminary result)   Collection Time: 02/20/19  6:39 PM  Result Value Ref Range   ABO/RH(D)      O POS Performed at [facility], [address] [place], [place] [address]   Albumin     Status: None   Collection Time: 02/20/19  8:26 PM  Result Value Ref Range   Albumin 4.3 3.5 - 5.0 g/dL    Comment: Performed at [facility], [address], [place], [place] [address]  VITAMIN D 25 Hydroxy (Vit-D  Deficiency, Fractures)     Status: Abnormal   Collection Time: 02/20/19  8:26 PM  Result Value Ref Range   Vit D, 25-Hydroxy 25.7 (L) 30.0 - 100.0 ng/mL    Comment: (NOTE) Vitamin D deficiency has been defined by the Institute of Medicine and an Endocrine Society practice guideline as a level of serum 25-OH vitamin D less than 20 ng/mL (1,2). The Endocrine Society went on to further define vitamin D insufficiency as a level between 21 and 29 ng/mL (2). 1. IOM (Institute of Medicine). 2010. Dietary reference   intakes for calcium and D. [place]: The   [employer]. 2. Holick MF, Binkley Starbuck, Bischoff-Ferrari HA, et al.   Evaluation, treatment, and prevention of vitamin D   deficiency: an Endocrine Society clinical practice   guideline. JCEM. 2011 Jul; 96(7):1911-30. Performed At: [facility] [address] [place], [place] [id number] Jolene Schimke MD IO:[id number]   MRSA PCR Screening     Status: None   Collection Time: 02/20/19 10:56 PM  Result Value Ref Range   MRSA by PCR NEGATIVE NEGATIVE    Comment:        The GeneXpert MRSA Assay (FDA approved for NASAL specimens only), is one component of a comprehensive MRSA colonization surveillance program. It is not intended to diagnose MRSA infection nor to guide or monitor treatment for MRSA infections. Performed at [facility], [address], [place], [place] [address]   TSH     Status: Abnormal   Collection Time: 02/21/19  4:52 AM  Result Value Ref Range   TSH 9.472 (H) 0.350 - 4.500 uIU/mL    Comment: Performed by a 3rd Generation assay with a functional sensitivity of <=0.01 uIU/mL. Performed at [facility], [address], [place], [place] [address]   CBC     Status: Abnormal   Collection Time: 02/21/19  4:52 AM  Result Value Ref Range   WBC 12.7 (H) 4.0 - 10.5 K/uL   RBC 4.22 4.22 - 5.81 MIL/uL   Hemoglobin 14.4 13.0 - 17.0 g/dL   HCT 36.6 44.0 -  34.7 %   MCV 102.1 (H) 80.0 - 100.0 fL   MCH 34.1 (H) 26.0 - 34.0 pg  MCHC 33.4 30.0 - 36.0 g/dL   RDW 41.3 24.4 - 01.0 %   Platelets 168 150 - 400 K/uL   nRBC 0.0 0.0 - 0.2 %    Comment: Performed at [facility], [address], [place], [place] [address]  Basic metabolic panel     Status: Abnormal   Collection Time: 02/21/19  4:52 AM  Result Value Ref Range   Sodium 137 135 - 145 mmol/L   Potassium 4.0 3.5 - 5.1 mmol/L   Chloride 102 98 - 111 mmol/L   CO2 26 22 - 32 mmol/L   Glucose, Bld 128 (H) 70 - 99 mg/dL   BUN 25 (H) 8 - 23 mg/dL   Creatinine, Ser 6.64 0.61 - 1.24 mg/dL   Calcium 8.8 (L) 8.9 - 10.3 mg/dL   GFR calc non Af Amer >60 >60 mL/min   GFR calc Af Amer >60 >60 mL/min   Anion gap 9 5 - 15    Comment: Performed at [facility], [address], [place], [place] [address]    Dg Chest 2 View  Result Date: 02/20/2019 CLINICAL DATA:  78 year old male status post mechanical fall with right hip pain EXAM: CHEST - 2 VIEW COMPARISON:  Concurrently obtained radiographs of the right hip FINDINGS: The patient is rotated toward the right which slightly distorts the cardiac and mediastinal contours. Within these limitations, the cardiac and mediastinal contours are likely within normal limits. Inspiratory volumes are low. There is mild pulmonary vascular congestion but no overt edema. No pleural effusion or pneumothorax. No evidence of acute fracture. IMPRESSION: 1. Low lung volumes with mild pulmonary vascular congestion but no interstitial edema. Electronically Signed   By: Malachy Moan M.D.   On: 02/20/2019 16:33   Dg Hip Unilat W Or Wo Pelvis 2-3 Views Right  Result Date: 02/20/2019 CLINICAL DATA:  Right hip pain after fall. EXAM: DG HIP (WITH OR WITHOUT PELVIS) 2-3V RIGHT COMPARISON:  None. FINDINGS: Moderately displaced fracture is seen involving the proximal right femoral neck. Left hip is unremarkable. IMPRESSION: Moderately displaced  proximal right femoral neck fracture. Electronically Signed   By: Lupita Raider, M.D.   On: 02/20/2019 16:32    ROS patient has history of dementia is somewhat poor historian.,  Chart review is used for review of systems.  Positive for alcohol abuse hypotension dehydration history of essential hypertension acute renal failure.  Negative for heart attack or stroke.  I talked with patient's daughter who relates he has had previous laceration of the finger with microsurgical repair.  Also previous knee surgery otherwise negative surgical history. Blood pressure 108/69, pulse 77, temperature 98.5 F (36.9 C), resp. rate 16, height 6' (1.829 m), weight 62.9 kg, SpO2 98 %. Physical Exam  Constitutional:  Thin elderly patient in bed poor historian.  Responsive to questions oriented to person place.  HENT:  Head: Normocephalic and atraumatic.  Eyes: Pupils are equal, round, and reactive to light. Conjunctivae are normal.  Neck: Normal range of motion. No tracheal deviation present. No thyromegaly present.  Cardiovascular: Normal rate.  Respiratory: Effort normal. No respiratory distress. He has no wheezes.  GI: Soft.  Musculoskeletal:     Comments: Right lower extremity shortened externally rotated pulses are normal.  Pain with right hip range of motion negative left hip range of motion.  Neurological:  Alert responsive oriented x2.  Psychiatric:  Pleasant cooperative and conversant.  Poor historian.    Assessment/Plan: Patient with displaced right femoral neck  fracture.  Plan right hip hemiarthroplasty with ambulation beginning tomorrow.  Patient has no pre-existing hip osteoarthritis.  Patient likely will require SNF before he returns assisted living.  Patient is at some increased risk due to his dementia.  Renal function shows creatinine normal at 1.14 with BUN of 25.  Procedure discussed with patient.  Questions were elicited and answered.  I called his daughter left a phone message will  recall her again.  Eldred Manges 02/21/2019, 8:18 AM   Addendum reach daughter by phone and discussed outlined plan for treatment.  Questions elicited and answered she agrees to proceed.

## 2019-02-21 NOTE — Op Note (Signed)
Preop diagnosis: Displaced closed right femoral neck fracture  Postop diagnosis: Same  Procedure: Right hip monopolar hemiarthroplasty.  Surgeon: Annell Greening, MD  Anesthesia: Spinal  EBL: 150 cc  Implants:Depuy Summit basic #4 stem +0 neck 52 mm ball monopolar.  Procedure: After informed consent spinal anesthesia preoperative Ancef prophylaxis patient was placed in the lateral position right hip up with good spinal.  Foley catheter was placed for he is placed on the operating room table.  Jemeka Wagler 7 frame was used axillary roll ChloraPrep down to the midcalf.  Usual sheets drapes sterile impervious stockinette Covan split sheets and drapes and Betadine Steri-Drape x2 was used to seal the skin for standard preparation for posterior approach hip.  Timeout procedure was completed.  Posterior approach was made patient was then gluteus maximus was split sciatic nerve palpated.  Posterior capsule opened protecting the sciatic nerve.  Neck was cut 1 fingerbreadth above the lesser trochanter head was moved with a corkscrew measured size 52 mm trial size gave good suction fit.  Preparation of the canal with the canal starter reamer canal finder cookie-cutter truck lateralizer and then sequential broaching up to #4 which gave a nice fill of the canal with good stability.  Trial neck +0 neck was inserted with 52 ball with flexion and 90 internal rotation 90 quad is not tight in extended position and leg lengths are equal by palpation of the knees.  Permanent stem was opened canal was irrigated.  Acetabular showed no degenerative changes.  Stem was inserted monopolar +0 neck 52 ball was inserted impacted checked to make sure was stable nerve protected again hip reduced and then repair of the piriformis back to gluteus medius with Ethibond suture.  Ethibond in the tensor fascia.  #1 and 0 Vicryl in the gluteus maximus fascia 2 and subtenons tissue skin staple closure postop dressing with Xeroform 4 x 4's ABD and tape.   Postoperative knee immobilizer was applied.

## 2019-02-21 NOTE — Anesthesia Procedure Notes (Signed)
Spinal  Patient location during procedure: OR Start time: 02/21/2019 10:29 AM End time: 02/21/2019 10:34 AM Staffing Anesthesiologist: Lowella Curb, MD Performed: anesthesiologist  Preanesthetic Checklist Completed: patient identified, site marked, surgical consent, pre-op evaluation, timeout performed, IV checked, risks and benefits discussed and monitors and equipment checked Spinal Block Patient position: right lateral decubitus Prep: Betadine Patient monitoring: heart rate, cardiac monitor, continuous pulse ox and blood pressure Approach: right paramedian Location: L3-4 Injection technique: single-shot Needle Needle type: Quincke  Needle gauge: 22 G Needle length: 9 cm Assessment Sensory level: T4

## 2019-02-22 LAB — CBC
HCT: 37 % — ABNORMAL LOW (ref 39.0–52.0)
Hemoglobin: 12 g/dL — ABNORMAL LOW (ref 13.0–17.0)
MCH: 33.7 pg (ref 26.0–34.0)
MCHC: 32.4 g/dL (ref 30.0–36.0)
MCV: 103.9 fL — ABNORMAL HIGH (ref 80.0–100.0)
Platelets: 124 10*3/uL — ABNORMAL LOW (ref 150–400)
RBC: 3.56 MIL/uL — ABNORMAL LOW (ref 4.22–5.81)
RDW: 12.5 % (ref 11.5–15.5)
WBC: 8 10*3/uL (ref 4.0–10.5)
nRBC: 0 % (ref 0.0–0.2)

## 2019-02-22 LAB — BASIC METABOLIC PANEL
Anion gap: 6 (ref 5–15)
BUN: 22 mg/dL (ref 8–23)
CO2: 25 mmol/L (ref 22–32)
CREATININE: 1.18 mg/dL (ref 0.61–1.24)
Calcium: 7.9 mg/dL — ABNORMAL LOW (ref 8.9–10.3)
Chloride: 105 mmol/L (ref 98–111)
GFR calc Af Amer: >60 mL/min (ref >60)
GFR calc non Af Amer: 58 mL/min — ABNORMAL LOW (ref >60)
Glucose, Bld: 119 mg/dL — ABNORMAL HIGH (ref 70–99)
Potassium: 4.1 mmol/L (ref 3.5–5.1)
Sodium: 136 mmol/L (ref 135–145)

## 2019-02-22 MED ORDER — BOOST PLUS PO LIQD
237.0000 mL | ORAL | Status: DC
  Administered 2019-02-23 – 2019-02-24 (×2): 237 mL via ORAL
  Filled 2019-02-22 (×4): qty 237

## 2019-02-22 MED ORDER — ADULT MULTIVITAMIN W/MINERALS CH
1.0000 | ORAL_TABLET | Freq: Every day | ORAL | Status: DC
  Administered 2019-02-22 – 2019-02-23 (×2): 1 via ORAL
  Filled 2019-02-22 (×2): qty 1

## 2019-02-22 NOTE — Evaluation (Signed)
Physical Therapy Evaluation Patient Details Name: Michael Weaver MRN: [id number] DOB: [birth year] Today's Date: 02/22/2019   History of Present Illness  78 y.o.maleadm after mechanical fall resulting in right femoral neck fx, s/p Right hip monopolar hemiarthroplasty per Dr Ophelia Charter on 02/21/19. PMH: hypertension and dementia hypothyroidism  Clinical Impression  Pt admitted with above diagnosis. Pt currently with functional limitations due to the deficits listed below (see PT Problem List).  Pt cooperative and able to follow commands today, no family present. Recommend SNF post acute, will follow in acute setting Pt will benefit from skilled PT to increase their independence and safety with mobility to allow discharge to the venue listed below.       Follow Up Recommendations SNF    Equipment Recommendations  None recommended by PT    Recommendations for Other Services       Precautions / Restrictions Precautions Precautions: Fall;Posterior Hip Required Braces or Orthoses: Knee Immobilizer - Right Restrictions Weight Bearing Restrictions: No RLE Weight Bearing: Weight bearing as tolerated      Mobility  Bed Mobility               General bed mobility comments: OOB with nursing, heavy +2 assist to come to sit on EOB per staff  Transfers Overall transfer level: Needs assistance Equipment used: Rolling walker (2 wheeled) Transfers: Sit to/from Stand Sit to Stand: Mod assist;+2 physical assistance         General transfer comment: assist to rise and stabilize, cues for hand placement, THP  Ambulation/Gait Ambulation/Gait assistance: Mod assist;Min assist;+2 physical assistance Gait Distance (Feet): 5 Feet Assistive device: Rolling walker (2 wheeled) Gait Pattern/deviations: Step-to pattern     General Gait Details: cues for sequence, use of UEs to self assist  Stairs            Wheelchair Mobility    Modified Rankin (Stroke Patients Only)        Balance Overall balance assessment: Needs assistance         Standing balance support: Bilateral upper extremity supported Standing balance-Leahy Scale: Poor Standing balance comment: reliant on external support and UEs                             Pertinent Vitals/Pain Pain Assessment: Faces Faces Pain Scale: Hurts even more Pain Location: right hip with activity Pain Descriptors / Indicators: Sore Pain Intervention(s): Monitored during session;Limited activity within patient's tolerance;Repositioned    Home Living Family/patient expects to be discharged to:: Skilled nursing facility                      Prior Function           Comments: no family available, pt unreliable historian     Hand Dominance        Extremity/Trunk Assessment   Upper Extremity Assessment Upper Extremity Assessment: Defer to OT evaluation    Lower Extremity Assessment Lower Extremity Assessment: RLE deficits/detail RLE Deficits / Details: AAROM grossly WFL within limits of THP, strength grossly 2+/5        Communication   Communication: No difficulties  Cognition Arousal/Alertness: Awake/alert Behavior During Therapy: WFL for tasks assessed/performed Overall Cognitive Status: History of cognitive impairments - at baseline  General Comments      Exercises     Assessment/Plan    PT Assessment Patient needs continued PT services  PT Problem List Decreased strength;Decreased activity tolerance;Decreased mobility;Decreased knowledge of use of DME;Decreased knowledge of precautions;Pain;Decreased safety awareness       PT Treatment Interventions DME instruction;Gait training;Functional mobility training;Therapeutic activities;Therapeutic exercise;Patient/family education    PT Goals (Current goals can be found in the Care Plan section)  Acute Rehab PT Goals Patient Stated Goal: none stated PT Goal  Formulation: With patient Time For Goal Achievement: 03/08/19 Potential to Achieve Goals: Good    Frequency Min 3X/week   Barriers to discharge        Co-evaluation               AM-PAC PT "6 Clicks" Mobility  Outcome Measure Help needed turning from your back to your side while in a flat bed without using bedrails?: A Lot Help needed moving from lying on your back to sitting on the side of a flat bed without using bedrails?: A Lot Help needed moving to and from a bed to a chair (including a wheelchair)?: A Lot Help needed standing up from a chair using your arms (e.g., wheelchair or bedside chair)?: A Lot Help needed to walk in hospital room?: A Lot Help needed climbing 3-5 steps with a railing? : A Lot 6 Click Score: 12    End of Session Equipment Utilized During Treatment: Gait belt Activity Tolerance: Patient limited by pain;Patient limited by fatigue Patient left: in chair;with call bell/phone within reach;with chair alarm set   PT Visit Diagnosis: Difficulty in walking, not elsewhere classified (R26.2)    Time: 2458-0998 PT Time Calculation (min) (ACUTE ONLY): 12 min   Charges:   PT Evaluation $PT Eval Low Complexity: 1 Low          Drucilla Chalet, PT  Pager: [phone] Acute Rehab Dept [facility]): [phone]   02/22/2019   [facility] 02/22/2019, 10:04 AM

## 2019-02-22 NOTE — Progress Notes (Signed)
Initial Nutrition Assessment  DOCUMENTATION CODES:   Non-severe (moderate) malnutrition in context of chronic illness  INTERVENTION:  - Will order Boost Plus once/day, each supplement provides 360 kcal and 14 grams of protein. - Will order daily multivitamin with minerals. - Continue to encourage PO intakes.   NUTRITION DIAGNOSIS:   Moderate Malnutrition related to chronic illness(dementia) as evidenced by mild fat depletion, moderate fat depletion, moderate muscle depletion.   GOAL:   Patient will meet greater than or equal to 90% of their needs  MONITOR:   PO intake, Supplement acceptance, Weight trends, Labs, Skin  REASON FOR ASSESSMENT:   Malnutrition Screening Tool  ASSESSMENT:   78 y.o. male with medical history significant of HTN, dementia, and hypothyroidism. Patient presented to the ED following a mechanical fall with associated R hip pain and deformity. EMS was called and transported patient to the ED.    BMI indicates normal weight/borderline underweight. Per review of RN flow sheet, patient consumed 25% of lunch and 50% of dinner last night. He is POD #1 surgery for R hip fx. Plan to PT/OT to evaluate.  Patient sitting in chart with breakfast tray in the room. He had consumed 100% of bacon and 75% of pancakes. He has dentures and denies any chewing or swallowing issues. He states that he usually eats small portions and on average he eats breakfast and then snacks when hungry the rest of the day (no other set meals on average). He drinks Boost once/day at home and is interested in continuing that while hospitalized. He lives alone and his daughter is the one who does the grocery shopping or makes meals for him but she has been very busy lately so he has not had access to adequate food. He states his granddaughter is planning to start making meals for him.   Current weight is 139 lb. Per review of chart, he weighed 140 lb on 12/16/17. Stable weight >1 year. Patient reports  UBW of 135-140 lb.   Medications reviewed; 100 mg colace BID. Labs reviewed; Ca: 7.9 mg/dL, GFR: 58 ml/min. IVF; 1/2 NS @ 50 ml/hr.     NUTRITION - FOCUSED PHYSICAL EXAM:    Most Recent Value  Orbital Region  Mild depletion  Upper Arm Region  Moderate depletion  Thoracic and Lumbar Region  Unable to assess  Buccal Region  Moderate depletion  Temple Region  Mild depletion  Clavicle Bone Region  Moderate depletion  Clavicle and Acromion Bone Region  Moderate depletion  Scapular Bone Region  Mild depletion  Dorsal Hand  Moderate depletion  Patellar Region  Unable to assess  Anterior Thigh Region  Unable to assess  Posterior Calf Region  Unable to assess  Edema (RD Assessment)  Unable to assess  Hair  Reviewed  Eyes  Reviewed  Mouth  Reviewed  Skin  Reviewed  Nails  Reviewed       Diet Order:   Diet Order            Diet regular Room service appropriate? Yes; Fluid consistency: Thin  Diet effective now              EDUCATION NEEDS:   No education needs have been identified at this time  Skin:  Skin Assessment: Skin Integrity Issues: Skin Integrity Issues:: Incisions Incisions: R hip (3/14)  Last BM:  3/15  Height:   Ht Readings from Last 1 Encounters:  02/20/19 6' (1.829 m)    Weight:   Wt Readings from Last 1  Encounters:  02/20/19 62.9 kg    Ideal Body Weight:  80.91 kg  BMI:  Body mass index is 18.81 kg/m.  Estimated Nutritional Needs:   Kcal:  1600-1800 kcal  Protein:  60-70 grams  Fluid:  >/= 1.7 L/day     Trenton Gammon, MS, RD, LDN, [facility] Inpatient Clinical Dietitian Pager # [phone] After hours/weekend pager # [phone]

## 2019-02-22 NOTE — Progress Notes (Signed)
PROGRESS NOTE    Michael Weaver  ZOX:[id number] DOB: 1941/07/26 DOA: 02/20/2019 PCP: Alois Cliche, PA-C    Brief Narrative:  78 y.o. male with medical history significant of hypertension and dementia hypothyroidism    Presented with   Mechanical fall as he slid out of the chair. Fell back wards no head injury.  Noted to have right away right hip pain with deformity and shortening EMS was called patient was given 100 mcg of fentanyl in route heart rate 70 blood pressure 124/70 satting 99% room air  Able to walk well with out any chest pain or shortness of breath no prior hx of CAD or lung disease   Assessment & Plan:   Principal Problem:   Closed displaced fracture of right femoral neck (HCC) Active Problems:   AKI (acute kidney injury) (HCC)   Leukocytosis   Essential hypertension   Hypothyroidism   Right femoral fracture (HCC)  Present on Admission: . Closed displaced fracture of right femoral neck (HCC) -  -  -Orthopedic Surgery consulted and is following. Pt now s/p surgery 3/14 -PT/OT eval with recs for SNF thus far  . AKI (acute kidney injury) (HCC)  -given gentle IVF hydration -Repeat renal panel reviewed, improved -recheck bmet in AM  . Leukocytosis  -in the setting of recent fall and fracture otherwise no underlying infectious source noted -Repeat WBC improved -Recheck cbc in AM  . Essential hypertension  -restart lisinopril once creatinine improves back to baseline -BP remains stable at this time  . Hypothyroidism  - TSH 9.47 - Currently on synthroid  DVT prophylaxis: SCD's Code Status: DNR Family Communication: Pt in room, family not at bedside Disposition Plan: Uncertain at this time  Consultants:   Orthopedic Surgery  Procedures:   Hip surgery 3/14  Antimicrobials: Anti-infectives (From admission, onward)   Start     Dose/Rate Route Frequency Ordered Stop   02/21/19 1018  ceFAZolin (ANCEF) 2-4 GM/100ML-% IVPB    Note to  Pharmacy:  Mirian Mo   : cabinet override      02/21/19 1018 02/21/19 1043   02/21/19 0600  ceFAZolin (ANCEF) IVPB 2g/100 mL premix     2 g 200 mL/hr over 30 Minutes Intravenous On call to O.R. 02/20/19 2237 02/21/19 1113      Subjective: No complaints at this time  Objective: Vitals:   02/21/19 2035 02/22/19 0201 02/22/19 0628 02/22/19 1338  BP: (!) 100/58 (!) 96/54 (!) 107/56 (!) 98/53  Pulse: 90 87 74 85  Resp: 15 17 16 14   Temp: 99 F (37.2 C) 98.7 F (37.1 C) 98.5 F (36.9 C) 98.6 F (37 C)  TempSrc: Oral Oral Oral   SpO2: 99% 100% 97% 95%  Weight:      Height:        Intake/Output Summary (Last 24 hours) at 02/22/2019 1641 Last data filed at 02/22/2019 1619 Gross per 24 hour  Intake 2344.16 ml  Output 1100 ml  Net 1244.16 ml   Filed Weights   02/20/19 2047  Weight: 62.9 kg    Examination: General exam: Awake, laying in bed, in nad Respiratory system: Normal respiratory effort, no wheezing Cardiovascular system: regular rate, s1, s2 Gastrointestinal system: Soft, nondistended, positive BS Central nervous system: CN2-12 grossly intact, strength intact Extremities: Perfused, no clubbing Skin: Normal skin turgor, no notable skin lesions seen Psychiatry: Mood normal // no visual hallucinations   Data Reviewed: I have personally reviewed following labs and imaging studies  CBC: Recent Labs  Lab 02/20/19 1603 02/21/19 0452 02/22/19 0341  WBC 17.0* 12.7* 8.0  NEUTROABS 15.6*  --   --   HGB 15.2 14.4 12.0*  HCT 45.9 43.1 37.0*  MCV 100.9* 102.1* 103.9*  PLT 180 168 124*   Basic Metabolic Panel: Recent Labs  Lab 02/20/19 1603 02/21/19 0452 02/22/19 0341  NA 141 137 136  K 4.3 4.0 4.1  CL 105 102 105  CO2 24 26 25   GLUCOSE 167* 128* 119*  BUN 25* 25* 22  CREATININE 1.28* 1.14 1.18  CALCIUM 9.2 8.8* 7.9*   GFR: Estimated Creatinine Clearance: 45.2 mL/min (by C-G formula based on SCr of 1.18 mg/dL). Liver Function Tests: Recent Labs  Lab  02/20/19 2026  ALBUMIN 4.3   No results for input(s): LIPASE, AMYLASE in the last 168 hours. No results for input(s): AMMONIA in the last 168 hours. Coagulation Profile: Recent Labs  Lab 02/20/19 1651  INR 0.9   Cardiac Enzymes: No results for input(s): CKTOTAL, CKMB, CKMBINDEX, TROPONINI in the last 168 hours. BNP (last 3 results) No results for input(s): PROBNP in the last 8760 hours. HbA1C: No results for input(s): HGBA1C in the last 72 hours. CBG: No results for input(s): GLUCAP in the last 168 hours. Lipid Profile: No results for input(s): CHOL, HDL, LDLCALC, TRIG, CHOLHDL, LDLDIRECT in the last 72 hours. Thyroid Function Tests: Recent Labs    02/21/19 0452  TSH 9.472*   Anemia Panel: No results for input(s): VITAMINB12, FOLATE, FERRITIN, TIBC, IRON, RETICCTPCT in the last 72 hours. Sepsis Labs: No results for input(s): PROCALCITON, LATICACIDVEN in the last 168 hours.  Recent Results (from the past 240 hour(s))  MRSA PCR Screening     Status: None   Collection Time: 02/20/19 10:56 PM  Result Value Ref Range Status   MRSA by PCR NEGATIVE NEGATIVE Final    Comment:        The GeneXpert MRSA Assay (FDA approved for NASAL specimens only), is one component of a comprehensive MRSA colonization surveillance program. It is not intended to diagnose MRSA infection nor to guide or monitor treatment for MRSA infections. Performed at [facility], [address], [place], [place] [address]      Radiology Studies: Pelvis Portable  Result Date: 02/21/2019 CLINICAL DATA:  Right hip replacement. EXAM: PORTABLE PELVIS 1-2 VIEWS COMPARISON:  None. FINDINGS: The patient is status post right hip replacement. Acetabular and femoral components are in good position. IMPRESSION: Right hip replacement as above. Electronically Signed   By: Gerome Sam III M.D   On: 02/21/2019 13:35    Scheduled Meds: . aspirin EC  325 mg Oral Q breakfast  . docusate sodium   100 mg Oral BID  . donepezil  10 mg Oral QHS  . FLUoxetine  40 mg Oral Daily  . lactose free nutrition  237 mL Oral Q24H  . levothyroxine  150 mcg Oral Q0600  . multivitamin with minerals  1 tablet Oral Daily  . naproxen  250 mg Oral BID WC   Continuous Infusions: . sodium chloride Stopped (02/22/19 1034)     LOS: 2 days   Rickey Barbara, MD [facility] Pager On Amion  If 7PM-7AM, please contact night-coverage 02/22/2019, 4:41 PM

## 2019-02-22 NOTE — Progress Notes (Signed)
Pt stable with no needs at time of bedside report with [facility]. No s/s of distress at this time. Pt up in chair at this time.

## 2019-02-23 LAB — BASIC METABOLIC PANEL
Anion gap: 7 (ref 5–15)
BUN: 24 mg/dL — ABNORMAL HIGH (ref 8–23)
CO2: 25 mmol/L (ref 22–32)
Calcium: 8 mg/dL — ABNORMAL LOW (ref 8.9–10.3)
Chloride: 105 mmol/L (ref 98–111)
Creatinine, Ser: 1.13 mg/dL (ref 0.61–1.24)
GFR calc Af Amer: >60 mL/min (ref >60)
Glucose, Bld: 141 mg/dL — ABNORMAL HIGH (ref 70–99)
Potassium: 4.1 mmol/L (ref 3.5–5.1)
Sodium: 137 mmol/L (ref 135–145)

## 2019-02-23 LAB — CBC
HCT: 36.8 % — ABNORMAL LOW (ref 39.0–52.0)
Hemoglobin: 11.9 g/dL — ABNORMAL LOW (ref 13.0–17.0)
MCH: 34.2 pg — ABNORMAL HIGH (ref 26.0–34.0)
MCHC: 32.3 g/dL (ref 30.0–36.0)
MCV: 105.7 fL — ABNORMAL HIGH (ref 80.0–100.0)
PLATELETS: 111 10*3/uL — AB (ref 150–400)
RBC: 3.48 MIL/uL — ABNORMAL LOW (ref 4.22–5.81)
RDW: 12.5 % (ref 11.5–15.5)
WBC: 7.9 10*3/uL (ref 4.0–10.5)
nRBC: 0 % (ref 0.0–0.2)

## 2019-02-23 NOTE — NC FL2 (Signed)
MEDICAID FL2 LEVEL OF CARE SCREENING TOOL     IDENTIFICATION  Patient Name: Michael Weaver Birthdate: [birth year] Sex: male Admission Date (Current Location): 02/20/2019  [facility] and [place] Number:  Producer, television/film/video and Address:  [facility],  [address] [place], [place] [address]      Provider Number: [id number]  Attending Physician Name and Address:  Jerald Kief, MD  Relative Name and Phone Number:       Current Level of Care: Hospital Recommended Level of Care: Skilled Nursing Facility Prior Approval Number:    Date Approved/Denied:   PASRR Number: pending   Discharge Plan: SNF    Current Diagnoses: Patient Active Problem List   Diagnosis Date Noted  . AKI (acute kidney injury) (HCC) 02/20/2019  . Leukocytosis 02/20/2019  . Essential hypertension 02/20/2019  . Hypothyroidism 02/20/2019  . Closed displaced fracture of right femoral neck (HCC) 02/20/2019  . Right femoral fracture (HCC) 02/20/2019  . 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 Alcohol abuse, unspecified 11/11/2013  . Weight loss, non-intentional 08/21/2012  . Status post tooth extraction 3 weeks ago 08/21/2012  . Abnormal TSH 08/21/2012  . Hypotension 08/20/2012  . ARF (acute renal failure) pre renal with tubular necrosis 08/20/2012  . Dementia (HCC) 08/20/2012  . Dehydration 08/20/2012  . Diarrhea 08/20/2012  . Hyperkalemia 08/20/2012  . Hyponatremia 08/20/2012    Orientation RESPIRATION BLADDER Height & Weight     Self, Time, Situation, Place  Normal Continent Weight: 138 lb 10.7 oz (62.9 kg) Height:  6' (182.9 cm)  BEHAVIORAL SYMPTOMS/MOOD NEUROLOGICAL BOWEL NUTRITION STATUS      Continent Diet(regular)  AMBULATORY STATUS COMMUNICATION OF NEEDS Skin   Extensive Assist Verbally Surgical wounds                       Personal Care Assistance Level of Assistance  Bathing, Feeding, Dressing Bathing Assistance: Limited assistance Feeding  assistance: Independent Dressing Assistance: Maximum assistance     Functional Limitations Info  Sight, Hearing, Speech Sight Info: Impaired(Wears Glasses) Hearing Info: Adequate Speech Info: Adequate    SPECIAL CARE FACTORS FREQUENCY  OT (By licensed OT), PT (By licensed PT)     PT Frequency: 5x/week OT Frequency: 5x/week            Contractures Contractures Info: Not present    Additional Factors Info  Code Status, Allergies, Psychotropic Code Status Info: DNR  Allergies Info: Allergies: No Known Allergies Psychotropic Info: prozac         Current Medications (02/23/2019):  This is the current hospital active medication list Current Facility-Administered Medications  Medication Dose Route Frequency Provider Last Rate Last Dose  . 0.45 % sodium chloride infusion   Intravenous Continuous Eldred Manges, MD   Stopped at 02/22/19 1034  . acetaminophen (TYLENOL) tablet 325-650 mg  325-650 mg Oral Q6H PRN Eldred Manges, MD   325 mg at 02/21/19 1626  . aspirin EC tablet 325 mg  325 mg Oral Q breakfast Eldred Manges, MD   325 mg at 02/23/19 0801  . docusate sodium (COLACE) capsule 100 mg  100 mg Oral BID Eldred Manges, MD   100 mg at 02/23/19 0801  . donepezil (ARICEPT) tablet 10 mg  10 mg Oral QHS Eldred Manges, MD   10 mg at 02/22/19 0834  . FLUoxetine (PROZAC) capsule 40 mg  40 mg Oral Daily Eldred Manges, MD   40 mg  at 02/23/19 0801  . HYDROcodone-acetaminophen (NORCO) 7.5-325 MG per tablet 1-2 tablet  1-2 tablet Oral Q4H PRN Eldred Manges, MD   1 tablet at 02/23/19 0801  . HYDROcodone-acetaminophen (NORCO/VICODIN) 5-325 MG per tablet 1-2 tablet  1-2 tablet Oral Q4H PRN Eldred Manges, MD   1 tablet at 02/21/19 1720  . lactose free nutrition (BOOST PLUS) liquid 237 mL  237 mL Oral Q24H Jerald Kief, MD      . levothyroxine (SYNTHROID, LEVOTHROID) tablet 150 mcg  150 mcg Oral Q0600 Eldred Manges, MD   150 mcg at 02/23/19 4970  . menthol-cetylpyridinium (CEPACOL) lozenge 3 mg   1 lozenge Oral PRN Eldred Manges, MD       Or  . phenol (CHLORASEPTIC) mouth spray 1 spray  1 spray Mouth/Throat PRN Eldred Manges, MD      . metoCLOPramide (REGLAN) tablet 5-10 mg  5-10 mg Oral Q8H PRN Eldred Manges, MD       Or  . metoCLOPramide (REGLAN) injection 5-10 mg  5-10 mg Intravenous Q8H PRN Eldred Manges, MD      . morphine 2 MG/ML injection 0.5-1 mg  0.5-1 mg Intravenous Q2H PRN Eldred Manges, MD      . multivitamin with minerals tablet 1 tablet  1 tablet Oral Daily Jerald Kief, MD   1 tablet at 02/22/19 1353  . naproxen (NAPROSYN) tablet 250 mg  250 mg Oral BID WC Eldred Manges, MD   250 mg at 02/23/19 0801  . ondansetron (ZOFRAN) tablet 4 mg  4 mg Oral Q6H PRN Eldred Manges, MD       Or  . ondansetron [facility]) injection 4 mg  4 mg Intravenous Q6H PRN Eldred Manges, MD      . polyethylene glycol (MIRALAX / GLYCOLAX) packet 17 g  17 g Oral Daily PRN Eldred Manges, MD         Discharge Medications: Please see discharge summary for a list of discharge medications.  Relevant Imaging Results:  Relevant Lab Results:   Additional Information YOV:[id number]  Clearance Coots, LCSW

## 2019-02-23 NOTE — TOC Initial Note (Signed)
Transition of Care [facility]) - Initial/Assessment Note    Patient Details  Name: Michael Weaver MRN: [id number] Date of Birth: Mar 10, 1941  [facility] CM/SW Contact:    Michael Weaver, [place] Phone Number: 02/23/2019, 12:06 PM  Clinical Narrative:                 CSW met with the patient at bedside, explain role and reason for visit. Patient receptive to CSW discussing discharge plan. Patient reports he lives in [place]. Patient reports prior to his fall he was independent with all ADL's. Patient is agreeable to short rehab at [facility]. Patient gave CSW permission to discuss SNF process and options with his daughter Michael Weaver. CSW explain SNF process to the patient daughter. CSW scanned list of bed offers to daughter email.  FL2 complete.      Expected Discharge Plan: Skilled Nursing Facility Barriers to Discharge: Insurance Authorization   Patient Goals and CMS Choice Patient states their goals for this hospitalization and ongoing recovery are:: to go to [facility] and return to independent living  CMS Medicare.gov Compare Post Acute Care list provided to:: Other (Comment Required)(Daughter Michael) Choice offered to / list presented to : Adult Children  Expected Discharge Plan and Services Expected Discharge Plan: [place] Acute Care Choice: [place] arrangements for the past 2 months: Michael Weaver Expected Discharge Date: (unknown)                       Prior Living Arrangements/Services Living arrangements for the past 2 months: [place] Lives with:: Self Patient language and need for interpreter reviewed:: No Do you feel safe going back to the place where you live?: Yes      Need for Family Participation in Patient Care: Yes (Comment) Care giver support system in place?: Yes (comment)   Criminal Activity/Legal Involvement Pertinent to Current  Situation/Hospitalization: No - Comment as needed  Activities of Daily Living Home Assistive Devices/Equipment: Eyeglasses, Dentures (specify type)(full set dentures) ADL Screening (condition at time of admission) Patient's cognitive ability adequate to safely complete daily activities?: No Is the patient deaf or have difficulty hearing?: No Does the patient have difficulty seeing, even when wearing glasses/contacts?: No Does the patient have difficulty concentrating, remembering, or making decisions?: Yes Patient able to express need for assistance with ADLs?: Yes Does the patient have difficulty dressing or bathing?: No Independently performs ADLs?: No Communication: Independent Dressing (OT): Independent Grooming: Independent Feeding: Independent Bathing: Independent Toileting: Needs assistance Is this a change from baseline?: Change from baseline, expected to last >3days In/Out Bed: Needs assistance Is this a change from baseline?: Change from baseline, expected to last >3 days Walks in Home: Dependent Is this a change from baseline?: Pre-admission baseline Does the patient have difficulty walking or climbing stairs?: Yes Weakness of Legs: Right Weakness of Arms/Hands: None  Permission Sought/Granted Permission sought to share information with : Family Supports Permission granted to share information with : Yes, Verbal Permission Granted  Share Information with NAME: Michael Weaver   Permission granted to share info w AGENCY: SNF in [place] area   Permission granted to share info w Relationship: Daughter   Permission granted to share info w Contact Information: [phone]  Emotional Assessment Appearance:: Appears stated age   Affect (typically observed): Accepting Orientation: : Oriented to Self, Oriented to Place, Oriented to  Time, Oriented to Situation Alcohol / Substance Use: Not Applicable  Psych Involvement: No (comment)  Admission diagnosis:  Closed fracture of  right hip, initial encounter [facility]) [S72.001A] Patient Active Problem List   Diagnosis Date Noted  . AKI (acute kidney injury) ([place]) 02/20/2019  . Leukocytosis 02/20/2019  . Essential hypertension 02/20/2019  . Hypothyroidism 02/20/2019  . Closed displaced fracture of right femoral neck ([place]) 02/20/2019  . Right femoral fracture ([place]) 02/20/2019  . 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 Alcohol abuse, unspecified 11/11/2013  . Weight loss, non-intentional 08/21/2012  . Status post tooth extraction 3 weeks ago 08/21/2012  . Abnormal TSH 08/21/2012  . Hypotension 08/20/2012  . ARF (acute renal failure) pre renal with tubular necrosis 08/20/2012  . Dementia ([place]) 08/20/2012  . Dehydration 08/20/2012  . Diarrhea 08/20/2012  . Hyperkalemia 08/20/2012  . Hyponatremia 08/20/2012   PCP:  Michael Potash, PA-C Pharmacy:   [facility], [place] [address] Phone: [phone] Fax: [place] [address] [place] [address] [place] [address] Phone: [phone] Fax: [phone]     Social Determinants of Health ([place]) Interventions    Readmission Risk Interventions 30 Day Unplanned Readmission Risk Score     ED to Hosp-Admission (Current) from 02/20/2019 in [place]  30 Day Unplanned Readmission Risk Score (%)  12 Filed at 02/23/2019 1200     This score is the patient's risk of an unplanned readmission within 30 days of being discharged (0 -100%). The score is based on dignosis, age, lab data, medications, orders, and past utilization.   Low:  0-14.9   Medium: 15-21.9   High: 22-29.9   Extreme: 30 and above       No flowsheet data found.

## 2019-02-23 NOTE — Progress Notes (Signed)
Physical Therapy Treatment Patient Details Name: Michael Weaver MRN: [id number] DOB: 11/28/1941 Today's Date: 02/23/2019    History of Present Illness 78 y.o.maleadm after mechanical fall resulting in right femoral neck fx, s/p Right hip monopolar hemiarthroplasty per Dr Ophelia Charter on 02/21/19. PMH: hypertension and dementia hypothyroidism    PT Comments    Pt progressing, incr amb distance today. Cooperative and motivated to work with PT. continue to recommend SNF.  Follow Up Recommendations  SNF     Equipment Recommendations  None recommended by PT    Recommendations for Other Services       Precautions / Restrictions Precautions Precautions: Fall;Posterior Hip Required Braces or Orthoses: Knee Immobilizer - Right Restrictions Weight Bearing Restrictions: No RLE Weight Bearing: Weight bearing as tolerated    Mobility  Bed Mobility Overal bed mobility: Needs Assistance Bed Mobility: Supine to Sit     Supine to sit: Mod assist     General bed mobility comments: cues to self assist; assist with RLE  and trunk  Transfers Overall transfer level: Needs assistance Equipment used: Rolling walker (2 wheeled) Transfers: Sit to/from Stand Sit to Stand: Mod assist;Min assist         General transfer comment: assist to rise and stabilize, cues for hand placement, THP  Ambulation/Gait Ambulation/Gait assistance: Min assist Gait Distance (Feet): 45 Feet Assistive device: Rolling walker (2 wheeled) Gait Pattern/deviations: Step-to pattern     General Gait Details: cues for sequence, use of UEs to self assist   Stairs             Wheelchair Mobility    Modified Rankin (Stroke Patients Only)       Balance             Standing balance-Leahy Scale: Poor Standing balance comment: reliant on external support and UEs                            Cognition Arousal/Alertness: Awake/alert Behavior During Therapy: WFL for tasks  assessed/performed Overall Cognitive Status: History of cognitive impairments - at baseline                                 General Comments: pt follows commands and is cooperative       Exercises General Exercises - Lower Extremity Ankle Circles/Pumps: AROM;Strengthening;10 reps Quad Sets: AROM;Strengthening;Both;10 reps Heel Slides: AAROM;Right;10 reps    General Comments        Pertinent Vitals/Pain Pain Assessment: 0-10 Pain Score: 5  Pain Location: right hip with activity Pain Descriptors / Indicators: Sore Pain Intervention(s): Limited activity within patient's tolerance;Monitored during session;Repositioned    Home Living                      Prior Function            PT Goals (current goals can now be found in the care plan section) Acute Rehab PT Goals Patient Stated Goal: none stated PT Goal Formulation: With patient Time For Goal Achievement: 03/08/19 Potential to Achieve Goals: Good Progress towards PT goals: Progressing toward goals    Frequency    Min 3X/week      PT Plan Current plan remains appropriate    Co-evaluation              AM-PAC PT "6 Clicks" Mobility   Outcome Measure  Help needed turning from  your back to your side while in a flat bed without using bedrails?: A Lot Help needed moving from lying on your back to sitting on the side of a flat bed without using bedrails?: A Lot Help needed moving to and from a bed to a chair (including a wheelchair)?: A Lot Help needed standing up from a chair using your arms (e.g., wheelchair or bedside chair)?: A Lot Help needed to walk in hospital room?: A Little Help needed climbing 3-5 steps with a railing? : A Lot 6 Click Score: 13    End of Session Equipment Utilized During Treatment: Gait belt Activity Tolerance: Patient tolerated treatment well Patient left: in chair;with call bell/phone within reach;with chair alarm set   PT Visit Diagnosis: Difficulty in  walking, not elsewhere classified (R26.2)     Time: 3818-2993 PT Time Calculation (min) (ACUTE ONLY): 17 min  Charges:  $Gait Training: 8-22 mins                     Drucilla Chalet, PT  Pager: [phone] Acute Rehab Dept [facility]): [phone]   02/23/2019    [facility] 02/23/2019, 3:22 PM

## 2019-02-23 NOTE — Care Management Important Message (Signed)
Important Message  Patient Details  Name: Michael Weaver MRN: [id number] Date of Birth: December 16, 1940   Medicare Important Message Given:  Yes    Caren Macadam 02/23/2019, 1:51 PMImportant Message  Patient Details  Name: Michael Weaver MRN: [id number] Date of Birth: 1941/10/12   Medicare Important Message Given:  Yes    Caren Macadam 02/23/2019, 1:51 PM

## 2019-02-23 NOTE — Progress Notes (Signed)
PROGRESS NOTE    Michael Weaver  VPX:[id number] DOB: 06-12-1941 DOA: 02/20/2019 PCP: Alois Cliche, PA-C    Brief Narrative:  79 y.o. male with medical history significant of hypertension and dementia hypothyroidism    Presented with   Mechanical fall as he slid out of the chair. Fell back wards no head injury.  Noted to have right away right hip pain with deformity and shortening EMS was called patient was given 100 mcg of fentanyl in route heart rate 70 blood pressure 124/70 satting 99% room air  Able to walk well with out any chest pain or shortness of breath no prior hx of CAD or lung disease   Assessment & Plan:   Principal Problem:   Closed displaced fracture of right femoral neck (HCC) Active Problems:   AKI (acute kidney injury) (HCC)   Leukocytosis   Essential hypertension   Hypothyroidism   Right femoral fracture (HCC)  Present on Admission: . Closed displaced fracture of right femoral neck (HCC) -  -  -Orthopedic Surgery consulted and is following. Pt now s/p surgery 3/14 -PT/OT eval with recs for SNF thus far, Discussed with SW, pending placement  . AKI (acute kidney injury) (HCC)  -given gentle IVF hydration -Repeat renal panel reviewed, improved -repeat AM BMET  . Leukocytosis  -in the setting of recent fall and fracture otherwise no underlying infectious source noted -Repeat WBC improved -repeat cbc in AM  . Essential hypertension  -restart lisinopril once creatinine improves back to baseline -BP remains stable at this time  . Hypothyroidism  - TSH 9.47 - Currently on synthroid  DVT prophylaxis: SCD's Code Status: DNR Family Communication: Pt in room, family not at bedside Disposition Plan: Uncertain at this time  Consultants:   Orthopedic Surgery  Procedures:   Hip surgery 3/14  Antimicrobials: Anti-infectives (From admission, onward)   Start     Dose/Rate Route Frequency Ordered Stop   02/21/19 1018  ceFAZolin (ANCEF) 2-4  GM/100ML-% IVPB    Note to Pharmacy:  Mirian Mo   : cabinet override      02/21/19 1018 02/21/19 1043   02/21/19 0600  ceFAZolin (ANCEF) IVPB 2g/100 mL premix     2 g 200 mL/hr over 30 Minutes Intravenous On call to O.R. 02/20/19 2237 02/21/19 1113      Subjective: Without complaints at this time  Objective: Vitals:   02/22/19 1338 02/22/19 2029 02/23/19 0452 02/23/19 1418  BP: (!) 98/53 100/65 109/73 119/71  Pulse: 85 90 80 81  Resp: 14 16 14 16   Temp: 98.6 F (37 C) 98.8 F (37.1 C) 98.4 F (36.9 C) 98 F (36.7 C)  TempSrc:  Oral Oral Oral  SpO2: 95% 97% 95% 98%  Weight:      Height:        Intake/Output Summary (Last 24 hours) at 02/23/2019 1608 Last data filed at 02/23/2019 0730 Gross per 24 hour  Intake 620 ml  Output 975 ml  Net -355 ml   Filed Weights   02/20/19 2047  Weight: 62.9 kg    Examination: General exam: Conversant, in no acute distress Respiratory system: normal chest rise, clear, no audible wheezing Cardiovascular system: regular rhythm, s1-s2 Gastrointestinal system: Nondistended, nontender, pos BS Central nervous system: No seizures, no tremors Extremities: No cyanosis, no joint deformities Skin: No rashes, no pallor Psychiatry: Affect normal // no auditory hallucinations   Data Reviewed: I have personally reviewed following labs and imaging studies  CBC: Recent Labs  Lab 02/20/19  1603 02/21/19 0452 02/22/19 0341 02/23/19 0352  WBC 17.0* 12.7* 8.0 7.9  NEUTROABS 15.6*  --   --   --   HGB 15.2 14.4 12.0* 11.9*  HCT 45.9 43.1 37.0* 36.8*  MCV 100.9* 102.1* 103.9* 105.7*  PLT 180 168 124* 111*   Basic Metabolic Panel: Recent Labs  Lab 02/20/19 1603 02/21/19 0452 02/22/19 0341 02/23/19 0352  NA 141 137 136 137  K 4.3 4.0 4.1 4.1  CL 105 102 105 105  CO2 24 26 25 25   GLUCOSE 167* 128* 119* 141*  BUN 25* 25* 22 24*  CREATININE 1.28* 1.14 1.18 1.13  CALCIUM 9.2 8.8* 7.9* 8.0*   GFR: Estimated Creatinine Clearance:  47.2 mL/min (by C-G formula based on SCr of 1.13 mg/dL). Liver Function Tests: Recent Labs  Lab 02/20/19 2026  ALBUMIN 4.3   No results for input(s): LIPASE, AMYLASE in the last 168 hours. No results for input(s): AMMONIA in the last 168 hours. Coagulation Profile: Recent Labs  Lab 02/20/19 1651  INR 0.9   Cardiac Enzymes: No results for input(s): CKTOTAL, CKMB, CKMBINDEX, TROPONINI in the last 168 hours. BNP (last 3 results) No results for input(s): PROBNP in the last 8760 hours. HbA1C: No results for input(s): HGBA1C in the last 72 hours. CBG: No results for input(s): GLUCAP in the last 168 hours. Lipid Profile: No results for input(s): CHOL, HDL, LDLCALC, TRIG, CHOLHDL, LDLDIRECT in the last 72 hours. Thyroid Function Tests: Recent Labs    02/21/19 0452  TSH 9.472*   Anemia Panel: No results for input(s): VITAMINB12, FOLATE, FERRITIN, TIBC, IRON, RETICCTPCT in the last 72 hours. Sepsis Labs: No results for input(s): PROCALCITON, LATICACIDVEN in the last 168 hours.  Recent Results (from the past 240 hour(s))  MRSA PCR Screening     Status: None   Collection Time: 02/20/19 10:56 PM  Result Value Ref Range Status   MRSA by PCR NEGATIVE NEGATIVE Final    Comment:        The GeneXpert MRSA Assay (FDA approved for NASAL specimens only), is one component of a comprehensive MRSA colonization surveillance program. It is not intended to diagnose MRSA infection nor to guide or monitor treatment for MRSA infections. Performed at [facility], [address], [place], [place] [address]      Radiology Studies: No results found.  Scheduled Meds: . aspirin EC  325 mg Oral Q breakfast  . docusate sodium  100 mg Oral BID  . donepezil  10 mg Oral QHS  . FLUoxetine  40 mg Oral Daily  . lactose free nutrition  237 mL Oral Q24H  . levothyroxine  150 mcg Oral Q0600  . multivitamin with minerals  1 tablet Oral Daily  . naproxen  250 mg Oral BID WC    Continuous Infusions: . sodium chloride Stopped (02/22/19 1034)     LOS: 3 days   Rickey Barbara, MD [facility] Pager On Amion  If 7PM-7AM, please contact night-coverage 02/23/2019, 4:08 PM

## 2019-02-24 ENCOUNTER — Encounter (HOSPITAL_COMMUNITY): Payer: Self-pay | Admitting: Orthopaedic Surgery

## 2019-02-24 DIAGNOSIS — E440 Moderate protein-calorie malnutrition: Secondary | ICD-10-CM

## 2019-02-24 LAB — BASIC METABOLIC PANEL
Anion gap: 7 (ref 5–15)
BUN: 23 mg/dL (ref 8–23)
CALCIUM: 8.2 mg/dL — AB (ref 8.9–10.3)
CO2: 26 mmol/L (ref 22–32)
Chloride: 107 mmol/L (ref 98–111)
Creatinine, Ser: 1.03 mg/dL (ref 0.61–1.24)
GFR calc Af Amer: >60 mL/min (ref >60)
GFR calc non Af Amer: >60 mL/min (ref >60)
Glucose, Bld: 119 mg/dL — ABNORMAL HIGH (ref 70–99)
Potassium: 4.2 mmol/L (ref 3.5–5.1)
Sodium: 140 mmol/L (ref 135–145)

## 2019-02-24 LAB — CBC
HCT: 36.7 % — ABNORMAL LOW (ref 39.0–52.0)
Hemoglobin: 12 g/dL — ABNORMAL LOW (ref 13.0–17.0)
MCH: 33.5 pg (ref 26.0–34.0)
MCHC: 32.7 g/dL (ref 30.0–36.0)
MCV: 102.5 fL — ABNORMAL HIGH (ref 80.0–100.0)
Platelets: 137 10*3/uL — ABNORMAL LOW (ref 150–400)
RBC: 3.58 MIL/uL — ABNORMAL LOW (ref 4.22–5.81)
RDW: 12.6 % (ref 11.5–15.5)
WBC: 8.1 10*3/uL (ref 4.0–10.5)
nRBC: 0 % (ref 0.0–0.2)

## 2019-02-24 NOTE — Progress Notes (Addendum)
Physical Therapy Treatment Patient Details Name: Michael Weaver MRN: [id number] DOB: [birth year] Today's Date: 02/24/2019    History of Present Illness 78 y.o.maleadm after mechanical fall resulting in right femoral neck fx, s/p Right hip monopolar hemiarthroplasty per Dr Ophelia Charter on 02/21/19. PMH: hypertension and dementia hypothyroidism    PT Comments    Pt is progressing toward PT goals; pt is at risk for falls dur to weakness, decr balance and impaired cognition--continue to recommend SNF post acute; continue to follow in acute setting  Follow Up Recommendations  SNF     Equipment Recommendations  None recommended by PT    Recommendations for Other Services       Precautions / Restrictions Precautions Precautions: Fall;Posterior Hip Required Braces or Orthoses: Knee Immobilizer - Right Restrictions Weight Bearing Restrictions: No RLE Weight Bearing: Weight bearing as tolerated    Mobility  Bed Mobility                  Transfers Overall transfer level: Needs assistance Equipment used: Rolling walker (2 wheeled) Transfers: Sit to/from Stand Sit to Stand: Mod assist;Min assist         General transfer comment: assist to rise and stabilize, cues for hand placement, THP  Ambulation/Gait Ambulation/Gait assistance: Min assist Gait Distance (Feet): 50 Feet Assistive device: Rolling walker (2 wheeled) Gait Pattern/deviations: Step-to pattern     General Gait Details: cues for sequence, for hip precautions during turns, RW position   Stairs             Wheelchair Mobility    Modified Rankin (Stroke Patients Only)       Balance             Standing balance-Leahy Scale: Poor Standing balance comment: reliant on external support and UEs                            Cognition Arousal/Alertness: Awake/alert Behavior During Therapy: WFL for tasks assessed/performed Overall Cognitive Status: History of cognitive impairments - at  baseline                                 General Comments: pt follows commands and is cooperative       Exercises General Exercises - Lower Extremity Ankle Circles/Pumps: AROM;Strengthening;10 reps Quad Sets: AROM;Strengthening;Both;10 reps Long Arc Quad: AROM;10 reps;Both Hip ABduction/ADduction: AAROM;Right;10 reps    General Comments        Pertinent Vitals/Pain Pain Assessment: 0-10 Pain Score: 5  Pain Location: right hip with activity Pain Descriptors / Indicators: Sore Pain Intervention(s): Limited activity within patient's tolerance;Monitored during session;Repositioned    Home Living                      Prior Function            PT Goals (current goals can now be found in the care plan section) Acute Rehab PT Goals Patient Stated Goal: none stated PT Goal Formulation: With patient Time For Goal Achievement: 03/08/19 Potential to Achieve Goals: Good Progress towards PT goals: Progressing toward goals    Frequency    Min 3X/week      PT Plan Current plan remains appropriate    Co-evaluation              AM-PAC PT "6 Clicks" Mobility   Outcome Measure  Help needed turning from your  back to your side while in a flat bed without using bedrails?: A Lot Help needed moving from lying on your back to sitting on the side of a flat bed without using bedrails?: A Lot Help needed moving to and from a bed to a chair (including a wheelchair)?: A Lot Help needed standing up from a chair using your arms (e.g., wheelchair or bedside chair)?: A Lot Help needed to walk in hospital room?: A Little Help needed climbing 3-5 steps with a railing? : A Lot 6 Click Score: 13    End of Session Equipment Utilized During Treatment: Gait belt Activity Tolerance: Patient tolerated treatment well Patient left: in chair;with call bell/phone within reach;with chair alarm set   PT Visit Diagnosis: Difficulty in walking, not elsewhere classified  (R26.2)     Time: 1001-1015 PT Time Calculation (min) (ACUTE ONLY): 14 min  Charges:  $Gait Training: 8-22 mins                     Drucilla Chalet, PT  Pager: [phone] Acute Rehab Dept [facility]): [phone]   02/24/2019    [facility] 02/24/2019, 10:24 AM

## 2019-02-24 NOTE — Progress Notes (Addendum)
PROGRESS NOTE    Michael Weaver  [id number]:[id number] DOB: [birth year] DOA: 02/20/2019 PCP: Alois Cliche, PA-C    Brief Narrative:  78 y.o. male with medical history significant of hypertension and dementia hypothyroidism    Presented with   Mechanical fall as he slid out of the chair. Fell back wards no head injury.  Noted to have right away right hip pain with deformity and shortening EMS was called patient was given 100 mcg of fentanyl in route heart rate 70 blood pressure 124/70 satting 99% room air  Able to walk well with out any chest pain or shortness of breath no prior hx of CAD or lung disease   Assessment & Plan:   Principal Problem:   Closed displaced fracture of right femoral neck (HCC) Active Problems:   AKI (acute kidney injury) (HCC)   Leukocytosis   Essential hypertension   Hypothyroidism   Right femoral fracture (HCC)   Malnutrition of moderate degree  Present on Admission: . Closed displaced fracture of right femoral neck (HCC) -  -  -Orthopedic Surgery consulted and is following. Pt now s/p surgery 3/14 -PT/OT eval with recs for SNF. SW helping with placement. Awaiting insurance clearance  . AKI (acute kidney injury) (HCC)  -given gentle IVF hydration -Repeat renal panel reviewed, improved -Stable at present  . Leukocytosis  -in the setting of recent fall and fracture otherwise no underlying infectious source noted -Repeat WBC improved and normalized -stable  . Essential hypertension  -restart lisinopril once creatinine improves back to baseline -BP remains stable at this time, stable  . Hypothyroidism  - TSH 9.47 - Currently on synthroid  DVT prophylaxis: SCD's Code Status: DNR Family Communication: Pt in room, family not at bedside Disposition Plan: SNF, pending insurance clearance  Consultants:   Orthopedic Surgery  Procedures:   Hip surgery 3/14  Antimicrobials: Anti-infectives (From admission, onward)   Start     Dose/Rate  Route Frequency Ordered Stop   02/21/19 1018  ceFAZolin (ANCEF) 2-4 GM/100ML-% IVPB    Note to Pharmacy:  Mirian Mo   : cabinet override      02/21/19 1018 02/21/19 1043   02/21/19 0600  ceFAZolin (ANCEF) IVPB 2g/100 mL premix     2 g 200 mL/hr over 30 Minutes Intravenous On call to O.R. 02/20/19 2237 02/21/19 1113      Subjective: No complaints. Eager to start therapy  Objective: Vitals:   02/23/19 0452 02/23/19 1418 02/23/19 2118 02/24/19 0504  BP: 109/73 119/71 121/73 122/78  Pulse: 80 81 96 75  Resp: 14 16 14 16   Temp: 98.4 F (36.9 C) 98 F (36.7 C) 98.5 F (36.9 C) 98.3 F (36.8 C)  TempSrc: Oral Oral Oral Oral  SpO2: 95% 98% 98% 96%  Weight:      Height:        Intake/Output Summary (Last 24 hours) at 02/24/2019 1308 Last data filed at 02/24/2019 0847 Gross per 24 hour  Intake 200 ml  Output 700 ml  Net -500 ml   Filed Weights   02/20/19 2047  Weight: 62.9 kg    Examination: General exam: Awake, laying in bed, in nad Respiratory system: Normal respiratory effort, no wheezing  Data Reviewed: I have personally reviewed following labs and imaging studies  CBC: Recent Labs  Lab 02/20/19 1603 02/21/19 0452 02/22/19 0341 02/23/19 0352 02/24/19 0458  WBC 17.0* 12.7* 8.0 7.9 8.1  NEUTROABS 15.6*  --   --   --   --  HGB 15.2 14.4 12.0* 11.9* 12.0*  HCT 45.9 43.1 37.0* 36.8* 36.7*  MCV 100.9* 102.1* 103.9* 105.7* 102.5*  PLT 180 168 124* 111* 137*   Basic Metabolic Panel: Recent Labs  Lab 02/20/19 1603 02/21/19 0452 02/22/19 0341 02/23/19 0352 02/24/19 0458  NA 141 137 136 137 140  K 4.3 4.0 4.1 4.1 4.2  CL 105 102 105 105 107  CO2 24 26 25 25 26   GLUCOSE 167* 128* 119* 141* 119*  BUN 25* 25* 22 24* 23  CREATININE 1.28* 1.14 1.18 1.13 1.03  CALCIUM 9.2 8.8* 7.9* 8.0* 8.2*   GFR: Estimated Creatinine Clearance: 51.7 mL/min (by C-G formula based on SCr of 1.03 mg/dL). Liver Function Tests: Recent Labs  Lab 02/20/19 2026  ALBUMIN 4.3    No results for input(s): LIPASE, AMYLASE in the last 168 hours. No results for input(s): AMMONIA in the last 168 hours. Coagulation Profile: Recent Labs  Lab 02/20/19 1651  INR 0.9   Cardiac Enzymes: No results for input(s): CKTOTAL, CKMB, CKMBINDEX, TROPONINI in the last 168 hours. BNP (last 3 results) No results for input(s): PROBNP in the last 8760 hours. HbA1C: No results for input(s): HGBA1C in the last 72 hours. CBG: No results for input(s): GLUCAP in the last 168 hours. Lipid Profile: No results for input(s): CHOL, HDL, LDLCALC, TRIG, CHOLHDL, LDLDIRECT in the last 72 hours. Thyroid Function Tests: No results for input(s): TSH, T4TOTAL, FREET4, T3FREE, THYROIDAB in the last 72 hours. Anemia Panel: No results for input(s): VITAMINB12, FOLATE, FERRITIN, TIBC, IRON, RETICCTPCT in the last 72 hours. Sepsis Labs: No results for input(s): PROCALCITON, LATICACIDVEN in the last 168 hours.  Recent Results (from the past 240 hour(s))  MRSA PCR Screening     Status: None   Collection Time: 02/20/19 10:56 PM  Result Value Ref Range Status   MRSA by PCR NEGATIVE NEGATIVE Final    Comment:        The GeneXpert MRSA Assay (FDA approved for NASAL specimens only), is one component of a comprehensive MRSA colonization surveillance program. It is not intended to diagnose MRSA infection nor to guide or monitor treatment for MRSA infections. Performed at [facility], [address] [place], [place] [address]      Radiology Studies: No results found.  Scheduled Meds: . aspirin EC  325 mg Oral Q breakfast  . donepezil  10 mg Oral QHS  . FLUoxetine  40 mg Oral Daily  . lactose free nutrition  237 mL Oral Q24H  . levothyroxine  150 mcg Oral Q0600  . multivitamin with minerals  1 tablet Oral Daily  . naproxen  250 mg Oral BID WC   Continuous Infusions: . sodium chloride Stopped (02/22/19 1034)     LOS: 4 days   Rickey Barbara, MD [facility]  Pager On Amion  If 7PM-7AM, please contact night-coverage 02/24/2019, 1:08 PM

## 2019-02-25 DIAGNOSIS — E440 Moderate protein-calorie malnutrition: Secondary | ICD-10-CM

## 2019-02-25 MED ORDER — ACETAMINOPHEN 325 MG PO TABS: 325.0000 mg | ORAL_TABLET | Freq: Four times a day (QID) | ORAL | 0 refills | Status: AC | PRN

## 2019-02-25 MED ORDER — POLYETHYLENE GLYCOL 3350 17 G PO PACK: 17.0000 g | PACK | Freq: Every day | ORAL | 0 refills | Status: AC | PRN

## 2019-02-25 MED ORDER — NAPROXEN 250 MG PO TABS: 250.0000 mg | ORAL_TABLET | Freq: Two times a day (BID) | ORAL | 0 refills | Status: AC

## 2019-02-25 MED ORDER — ASPIRIN 325 MG PO TBEC: 325.0000 mg | DELAYED_RELEASE_TABLET | Freq: Every day | ORAL | 0 refills | Status: AC

## 2019-02-25 NOTE — Progress Notes (Signed)
Patient discharged to [facility] via [place]. Report called to Diane. All belongings w/ patient.

## 2019-02-25 NOTE — Discharge Summary (Addendum)
Physician Discharge Summary  Patient ID: Michael Weaver MRN: [id number] DOB/AGE: [birth year] 78 y.o.  Admit date: 02/20/2019 Discharge date: 02/25/2019  Admission Diagnoses:  Discharge Diagnoses:  Principal Problem:   Closed displaced fracture of right femoral neck (HCC) Active Problems:   AKI (acute kidney injury) (HCC)   Leukocytosis   Essential hypertension   Hypothyroidism   Right femoral fracture (HCC)   Malnutrition of moderate degree  Discharged Condition: good  Hospital Course: 78 y.o.malewith medical history significant of hypertension and dementia hypothyroidism.  Presented withMechanical fall as he slid out of the chair.Fell back wards no head injury.Noted to have right away right hip pain with deformity and shortening EMS was called patient was given 100 mcg of fentanyl in route heart rate 70 blood pressure 124/70 satting 99% room air.  The patient's mild AKI has resolved with IV fluid. His creatinine on dc is 1.03.  Able to walk well with out any chest pain or shortness of breath no prior hx of CAD or lung disease. The patient is appropriate for discharge to rehab today.  Consults: orthopedic surgery  Significant Diagnostic Studies: radiology: X-Ray: Hip Unilateral W or WO pelvis 2-3 Views Right : Moderately displaced proximal right femoral neck fracture.  Treatments: surgery: Right hip monopolar hemiarthroplasty.  Discharge Exam: Blood pressure 132/80, pulse 87, temperature 98.1 F (36.7 C), temperature source Oral, resp. rate 16, height 6' (1.829 m), weight 62.9 kg, SpO2 97 %. General appearance: alert, cooperative and no distress Neck: no adenopathy, no carotid bruit, no JVD, supple, symmetrical, trachea midline and thyroid not enlarged, symmetric, no tenderness/mass/nodules Resp: No increased work of breathing. No wheezes, rales, or rhonchi. No tactile fremitus. Chest wall: no tenderness Cardio: regular rate and rhythm, S1, S2 normal, no murmur,  click, rub or gallop GI: soft, non-tender; bowel sounds normal; no masses,  no organomegaly Extremities: extremities normal, atraumatic, no cyanosis or edema Pulses: 2+ and symmetric Skin: Skin color, texture, turgor normal. No rashes or lesions Neurologic: Grossly normal  Disposition: Discharge disposition: 03-Skilled Nursing Facility       Discharge Instructions    Call MD for:  persistant nausea and vomiting   Complete by:  As directed    Call MD for:  persistant nausea and vomiting   Complete by:  As directed    Call MD for:  severe uncontrolled pain   Complete by:  As directed    Call MD for:  severe uncontrolled pain   Complete by:  As directed    Diet - low sodium heart healthy   Complete by:  As directed    Discharge instructions   Complete by:  As directed    Follow up with orthopedic surgery as directed.   Discharge wound care:   Complete by:  As directed    As per orthopedic surgery   Increase activity slowly   Complete by:  As directed    Other Restrictions   Complete by:  As directed    WBAT Rt lower extremities Fall precautions and Right posterior hip precautions. Right knee immobilizer   Weight bearing as tolerated   Complete by:  As directed      Allergies as of 02/25/2019   No Known Allergies     Medication List    TAKE these medications   acetaminophen 325 MG tablet Commonly known as:  TYLENOL Take 1-2 tablets (325-650 mg total) by mouth every 6 (six) hours as needed for mild pain (pain score 1-3 or temp > 100.5).  Limit total daily acetominophen to 4 gm.   ALPRAZolam 0.25 MG tablet Commonly known as:  Xanax Take 1 tablet (0.25 mg total) by mouth once.   aspirin 325 MG EC tablet Take 1 tablet (325 mg total) by mouth daily with breakfast. Start taking on:  February 26, 2019   citalopram 40 MG tablet Commonly known as:  CELEXA Take 0.5 tablets (20 mg total) by mouth daily.   donepezil 10 MG tablet Commonly known as:  ARICEPT Take 1 tablet  (10 mg total) by mouth at bedtime.   FLUoxetine 40 MG capsule Commonly known as:  PROZAC Take 40 mg by mouth daily.   HYDROcodone-acetaminophen 5-325 MG tablet Commonly known as:  NORCO/VICODIN Take 1-2 tablets by mouth every 6 (six) hours as needed for moderate pain.   levothyroxine 150 MCG tablet Commonly known as:  SYNTHROID, LEVOTHROID Take 150 mcg by mouth daily.   lisinopril 10 MG tablet Commonly known as:  PRINIVIL,ZESTRIL Take 10 mg by mouth daily.   Melatonin 10 MG Tabs Take 1 tablet by mouth at bedtime.   Multivitamin Adult Tabs Take 1 tablet by mouth daily.   naproxen 250 MG tablet Commonly known as:  NAPROSYN Take 1 tablet (250 mg total) by mouth 2 (two) times daily with a meal.   polyethylene glycol packet Commonly known as:  MIRALAX / GLYCOLAX Take 17 g by mouth daily as needed for mild constipation.            Discharge Care Instructions  (From admission, onward)         Start     Ordered   02/25/19 0000  Discharge wound care:    Comments:  As per orthopedic surgery   02/25/19 0954   02/21/19 0000  Weight bearing as tolerated     02/21/19 1129          Contact information for follow-up providers    Eldred Manges, MD In 2 weeks.   Specialty:  Orthopedic Surgery Contact information: [address] [place] [address] [phone]            Contact information for after-discharge care    Destination    [facility] Preferred SNF .   Service:  Skilled Nursing Contact information: [address] [place] [address] [phone]                 I have seen and examined this patient myself. I have spent 38 minutes in his evaluation and care. Signed: Ethyl Weaver 02/25/2019, 11:00 AM

## 2019-02-25 NOTE — Progress Notes (Signed)
Physical Therapy Treatment Patient Details Name: Michael Weaver MRN: [id number] DOB: May 10, 1941 Today's Date: 02/25/2019    History of Present Illness 78 y.o.maleadm after mechanical fall resulting in right femoral neck fx, s/p Right hip monopolar hemiarthroplasty per Dr Ophelia Charter on 02/21/19. PMH: hypertension and dementia hypothyroidism    PT Comments    Pt ambulated in hallway and wished to return to bed.  Pt assisted with performing a couple exercises in supine.  Continue to recommend SNF upon d/c as pt remains high fall risk, requires assist for mobility, and cues for maintaining posterior hip precautions.    Follow Up Recommendations  SNF     Equipment Recommendations  None recommended by PT    Recommendations for Other Services       Precautions / Restrictions Precautions Precautions: Fall;Posterior Hip Required Braces or Orthoses: Knee Immobilizer - Right Restrictions Weight Bearing Restrictions: No RLE Weight Bearing: Weight bearing as tolerated    Mobility  Bed Mobility Overal bed mobility: Needs Assistance Bed Mobility: Supine to Sit;Sit to Supine     Supine to sit: Min assist Sit to supine: Min assist   General bed mobility comments: pt initiating, assist with RLE to maintain precautions  Transfers Overall transfer level: Needs assistance Equipment used: Rolling walker (2 wheeled) Transfers: Sit to/from Stand Sit to Stand: Min assist         General transfer comment: assist to rise and stabilize, cues for hand placement, THP  Ambulation/Gait Ambulation/Gait assistance: Min assist Gait Distance (Feet): 50 Feet Assistive device: Rolling walker (2 wheeled) Gait Pattern/deviations: Step-to pattern;Antalgic     General Gait Details: cues for sequence, for hip precautions during turns, RW position   Stairs             Wheelchair Mobility    Modified Rankin (Stroke Patients Only)       Balance Overall balance assessment: Needs  assistance         Standing balance support: Bilateral upper extremity supported Standing balance-Leahy Scale: Poor Standing balance comment: reliant on external support and UEs                            Cognition Arousal/Alertness: Awake/alert Behavior During Therapy: WFL for tasks assessed/performed Overall Cognitive Status: History of cognitive impairments - at baseline                                 General Comments: pt follows commands and is cooperative       Exercises General Exercises - Lower Extremity Ankle Circles/Pumps: AROM;Strengthening;10 reps;Both Quad Sets: AROM;Strengthening;Both;10 reps Heel Slides: AAROM;Right;10 reps Hip ABduction/ADduction: AAROM;Right;10 reps    General Comments        Pertinent Vitals/Pain Pain Assessment: Faces Faces Pain Scale: Hurts little more Pain Location: right hip with activity Pain Descriptors / Indicators: Sore Pain Intervention(s): Monitored during session;Repositioned;Limited activity within patient's tolerance    Home Living                      Prior Function            PT Goals (current goals can now be found in the care plan section) Progress towards PT goals: Progressing toward goals    Frequency    Min 3X/week      PT Plan Current plan remains appropriate    Co-evaluation  AM-PAC PT "6 Clicks" Mobility   Outcome Measure  Help needed turning from your back to your side while in a flat bed without using bedrails?: A Little Help needed moving from lying on your back to sitting on the side of a flat bed without using bedrails?: A Lot Help needed moving to and from a bed to a chair (including a wheelchair)?: A Lot Help needed standing up from a chair using your arms (e.g., wheelchair or bedside chair)?: A Lot Help needed to walk in hospital room?: A Little Help needed climbing 3-5 steps with a railing? : A Lot 6 Click Score: 14    End of  Session Equipment Utilized During Treatment: Gait belt Activity Tolerance: Patient tolerated treatment well Patient left: with call bell/phone within reach;in bed;with bed alarm set   PT Visit Diagnosis: Difficulty in walking, not elsewhere classified (R26.2)     Time: 1020-1036 PT Time Calculation (min) (ACUTE ONLY): 16 min  Charges:  $Gait Training: 8-22 mins                    Zenovia Jarred, PT, DPT Acute Rehabilitation Services Office: [phone] Pager: [phone]  Sarajane Jews 02/25/2019, 11:54 AM

## 2019-02-25 NOTE — TOC Transition Note (Signed)
Transition of Care [facility]) - CM/SW Discharge Note   Patient Details  Name: Michael Weaver MRN: [id number] Date of Birth: 08-11-1941  [facility]) CM/SW Contact:  Clearance Coots, LCSW Phone Number: 02/25/2019, 9:41 AM   Clinical Narrative:    Nurse call report to: [phone] PT[facility] to transport.    Final next level of care: Skilled Nursing Facility Barriers to Discharge: Insurance Authorization   Patient Goals and CMS Choice Patient states their goals for this hospitalization and ongoing recovery are:: to go to [facility] and return to independent living  CMS Medicare.gov Compare Post Acute Care list provided to:: Other (Comment Required)(Daughter Sindi) Choice offered to / list presented to : Adult Children  Discharge Placement PASRR number recieved: 02/24/19([phone]) Existing PASRR number confirmed : 02/25/19          Patient [facility] bed at: [facility] Patient to be transferred to facility by: ptar Name of family member notified: Daughter- Sindi Patient and family notified of of transfer: 02/25/19  Discharge Plan and Services   Post Acute Care Choice: Skilled Nursing Facility                   Social Determinants of Health (SDOH) Interventions     Readmission Risk Interventions No flowsheet data found.

## 2019-02-25 NOTE — Plan of Care (Signed)
  Problem: Education: Goal: Knowledge of General Education information will improve Description Including pain rating scale, medication(s)/side effects and non-pharmacologic comfort measures Outcome: Progressing   Problem: Clinical Measurements: Goal: Respiratory complications will improve Outcome: Progressing   Problem: Elimination: Goal: Will not experience complications related to bowel motility Outcome: Progressing Goal: Will not experience complications related to urinary retention Outcome: Progressing   Problem: Pain Managment: Goal: General experience of comfort will improve Outcome: Progressing   Problem: Safety: Goal: Ability to remain free from injury will improve Outcome: Progressing

## 2019-03-12 ENCOUNTER — Telehealth (INDEPENDENT_AMBULATORY_CARE_PROVIDER_SITE_OTHER): Payer: Self-pay | Admitting: Radiology

## 2019-03-12 NOTE — Telephone Encounter (Signed)
Patient has discharged home from [facility] with orders to [facility] for PT, OT, Nursing, and an Aide.  Michael Weaver would like to verify ok for orders.  Please advise.   CB for [employer] [phone]

## 2019-03-12 NOTE — Telephone Encounter (Signed)
I called Tanya and advised. 

## 2019-03-12 NOTE — Telephone Encounter (Signed)
OK - thanks

## 2019-03-17 ENCOUNTER — Telehealth (INDEPENDENT_AMBULATORY_CARE_PROVIDER_SITE_OTHER): Payer: Self-pay | Admitting: Orthopaedic Surgery

## 2019-03-17 NOTE — Telephone Encounter (Signed)
Joannie Springs from [facility] called stating that the facility that the patient is in is on lockdown and will not be able to get out to get his staples removed as instructed.  Apolinar Junes is wanting to get VO to remove the staples and any instructions for after care such as steri strips, etc.  CB#[phone].  Thank you.

## 2019-03-17 NOTE — Telephone Encounter (Signed)
Please advise 

## 2019-03-18 NOTE — Telephone Encounter (Signed)
Ucall.  OK to remove staples apply steri strips thanks he can see me in one month thanks

## 2019-03-18 NOTE — Telephone Encounter (Signed)
I called Apolinar Junes and advised.

## 2020-01-19 IMAGING — CR CHEST - 2 VIEW
2 series · 2 of 2 positions shown · non-contrast
Comparison: Concurrently obtained radiographs of the right hip

CLINICAL DATA: 78-year-old male status post mechanical fall with
right hip pain

EXAM:
CHEST - 2 VIEW

[x chest ap]
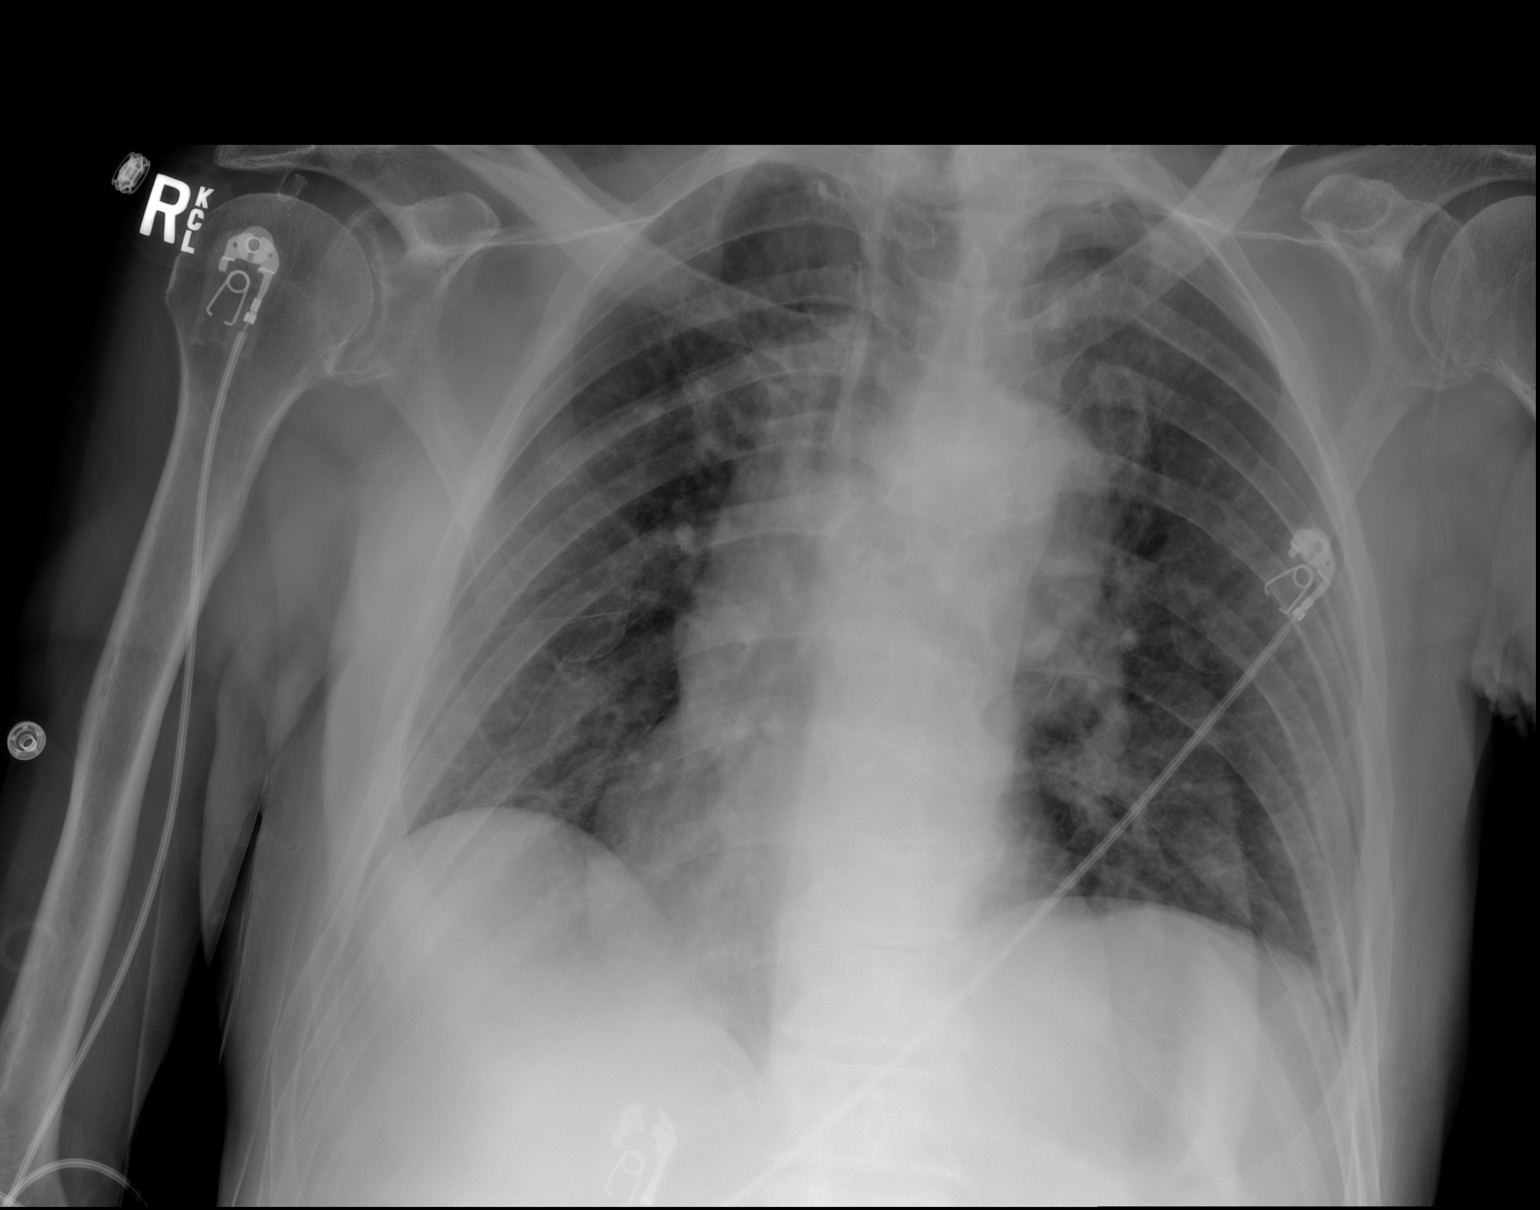

[w chest lat]
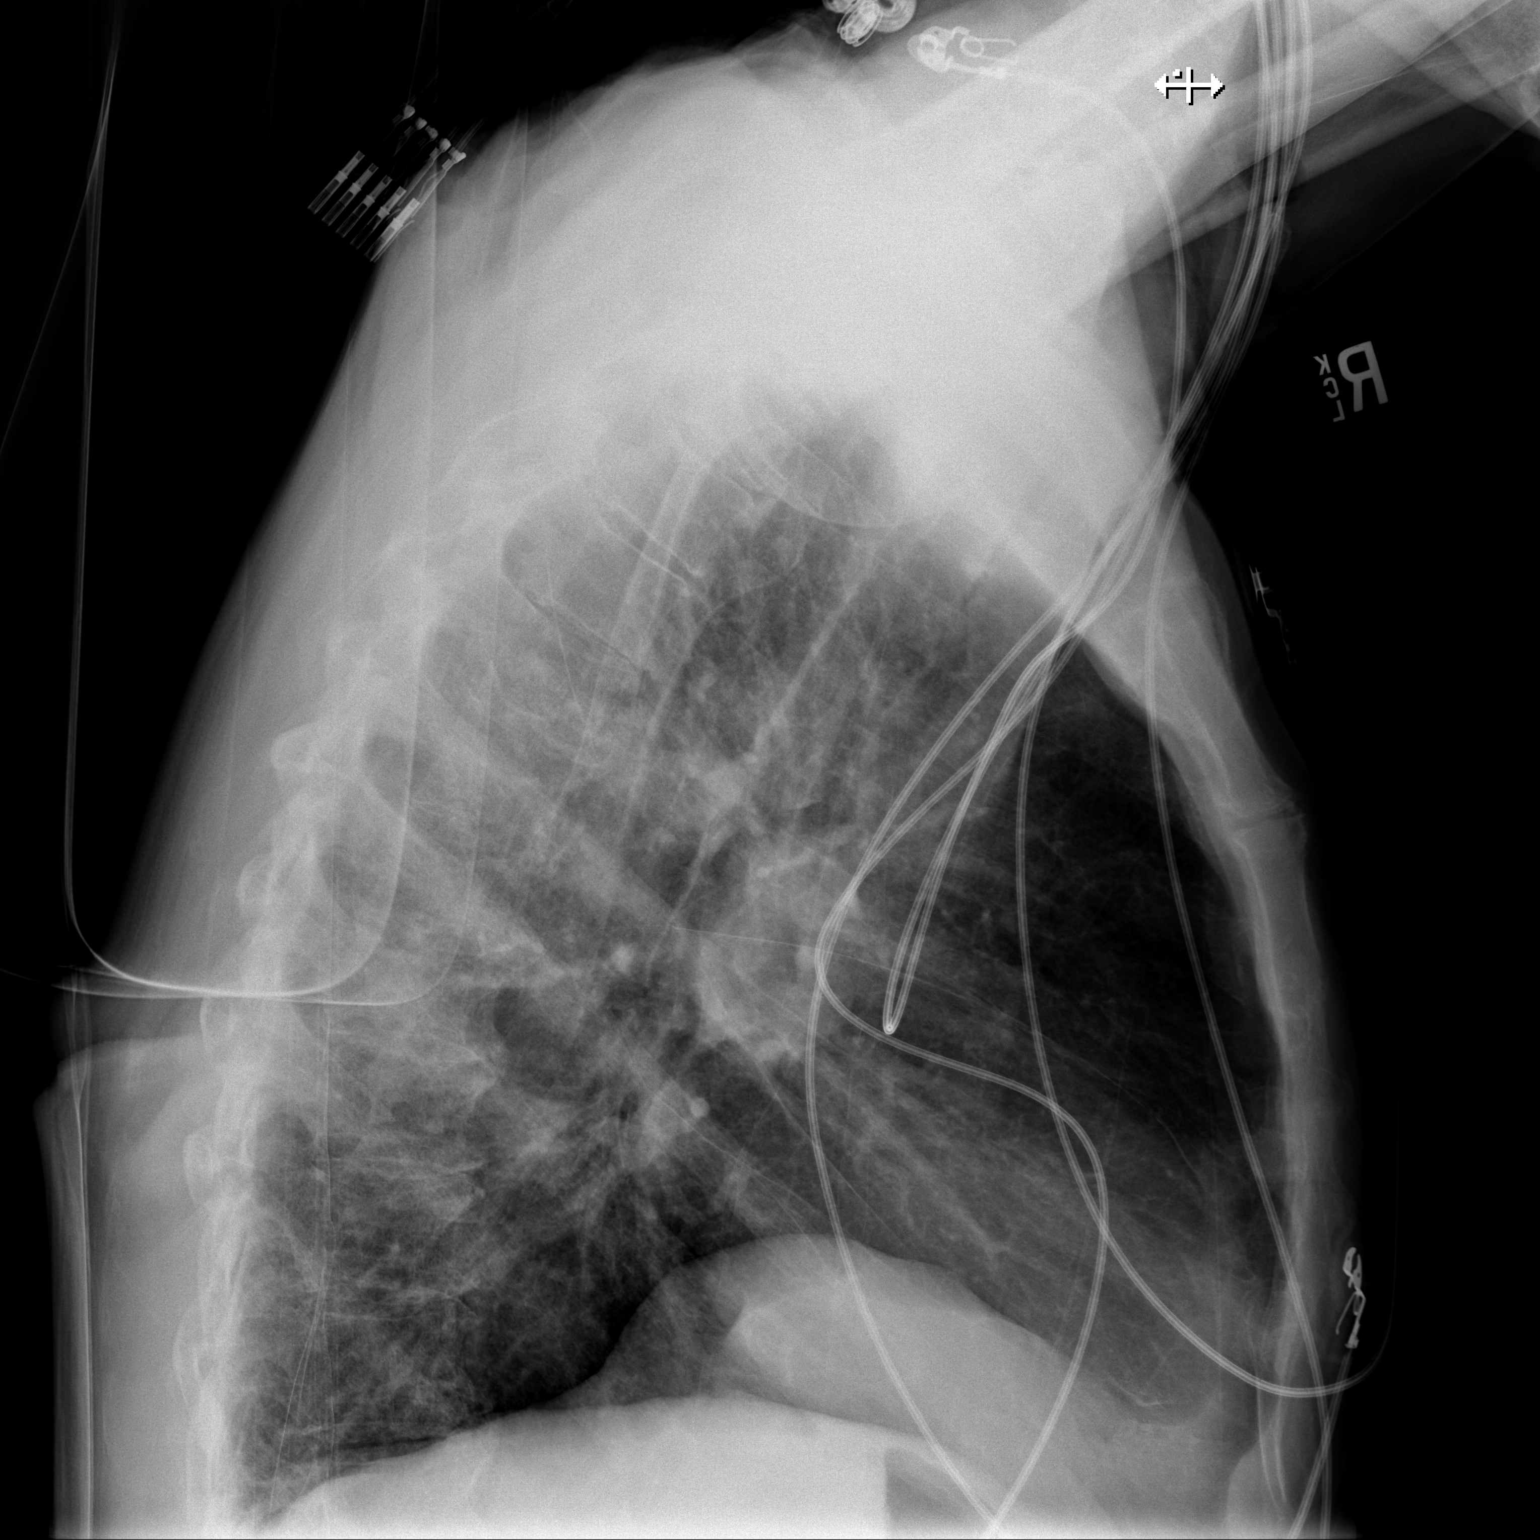

[2 of 2 positions shown; findings below may reference images not displayed]

FINDINGS: The patient is rotated toward the right which slightly distorts the
cardiac and mediastinal contours. Within these limitations, the
cardiac and mediastinal contours are likely within normal limits.
Inspiratory volumes are low. There is mild pulmonary vascular
congestion but no overt edema. No pleural effusion or pneumothorax.
No evidence of acute fracture.
IMPRESSION: 1. Low lung volumes with mild pulmonary vascular congestion but no
interstitial edema.
